# Patient Record
Sex: Female | Born: 1960 | Hispanic: No | Marital: Married | Smoking: Former smoker
Health system: Southern US, Community
[De-identification: ages and names within clinical notes are randomized; demographics above are authoritative.]

## PROBLEM LIST (undated history)

## (undated) DIAGNOSIS — F419 Anxiety disorder, unspecified: Secondary | ICD-10-CM

## (undated) DIAGNOSIS — F329 Major depressive disorder, single episode, unspecified: Secondary | ICD-10-CM

## (undated) DIAGNOSIS — F32A Depression, unspecified: Secondary | ICD-10-CM

## (undated) DIAGNOSIS — J439 Emphysema, unspecified: Secondary | ICD-10-CM

## (undated) HISTORY — PX: BACK SURGERY: SHX140

---
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
## 1998-10-28 ENCOUNTER — Other Ambulatory Visit: Admission: RE | Admit: 1998-10-28 | Discharge: 1998-10-28 | Payer: Self-pay | Admitting: Obstetrics and Gynecology

## 1999-12-30 ENCOUNTER — Other Ambulatory Visit: Admission: RE | Admit: 1999-12-30 | Discharge: 1999-12-30 | Payer: Self-pay | Admitting: Obstetrics and Gynecology

## 2000-10-25 ENCOUNTER — Other Ambulatory Visit: Admission: RE | Admit: 2000-10-25 | Discharge: 2000-10-25 | Payer: Self-pay | Admitting: Obstetrics and Gynecology

## 2002-01-15 ENCOUNTER — Other Ambulatory Visit: Admission: RE | Admit: 2002-01-15 | Discharge: 2002-01-15 | Payer: Self-pay | Admitting: Obstetrics and Gynecology

## 2003-01-18 ENCOUNTER — Other Ambulatory Visit: Admission: RE | Admit: 2003-01-18 | Discharge: 2003-01-18 | Payer: Self-pay | Admitting: Obstetrics and Gynecology

## 2004-01-21 ENCOUNTER — Other Ambulatory Visit: Admission: RE | Admit: 2004-01-21 | Discharge: 2004-01-21 | Payer: Self-pay | Admitting: Obstetrics and Gynecology

## 2005-11-03 ENCOUNTER — Other Ambulatory Visit: Admission: RE | Admit: 2005-11-03 | Discharge: 2005-11-03 | Payer: Self-pay | Admitting: Obstetrics and Gynecology

## 2013-03-03 DIAGNOSIS — F17200 Nicotine dependence, unspecified, uncomplicated: Secondary | ICD-10-CM

## 2013-03-03 DIAGNOSIS — E559 Vitamin D deficiency, unspecified: Secondary | ICD-10-CM

## 2013-03-03 HISTORY — DX: Vitamin D deficiency, unspecified: E55.9

## 2013-03-03 HISTORY — DX: Nicotine dependence, unspecified, uncomplicated: F17.200

## 2013-03-12 ENCOUNTER — Ambulatory Visit: Payer: Self-pay | Admitting: Licensed Clinical Social Worker

## 2013-03-15 DIAGNOSIS — F909 Attention-deficit hyperactivity disorder, unspecified type: Secondary | ICD-10-CM | POA: Diagnosis present

## 2013-03-16 ENCOUNTER — Ambulatory Visit: Payer: Self-pay | Admitting: Licensed Clinical Social Worker

## 2014-03-07 ENCOUNTER — Other Ambulatory Visit: Payer: Self-pay | Admitting: Orthopaedic Surgery

## 2014-03-07 DIAGNOSIS — M545 Low back pain, unspecified: Secondary | ICD-10-CM

## 2014-03-07 DIAGNOSIS — M546 Pain in thoracic spine: Secondary | ICD-10-CM

## 2014-03-14 ENCOUNTER — Other Ambulatory Visit: Payer: Self-pay

## 2014-03-14 ENCOUNTER — Inpatient Hospital Stay
Admission: RE | Admit: 2014-03-14 | Discharge: 2014-03-14 | Disposition: A | Discharge status: Home / Self Care | Payer: Self-pay | Source: Ambulatory Visit | Attending: Orthopaedic Surgery | Admitting: Orthopaedic Surgery

## 2014-03-14 NOTE — Discharge Instructions (Signed)
Myelogram Discharge Instructions  1. Go home and rest quietly for the next 24 hours.  It is important to lie flat for the next 24 hours.  Get up only to go to the restroom.  You may lie in the bed or on a couch on your back, your stomach, your left side or your right side.  You may have one pillow under your head.  You may have pillows between your knees while you are on your side or under your knees while you are on your back.  2. DO NOT drive today.  Recline the seat as far back as it will go, while still wearing your seat belt, on the way home.  3. You may get up to go to the bathroom as needed.  You may sit up for 10 minutes to eat.  You may resume your normal diet and medications unless otherwise indicated.  Drink lots of extra fluids today and tomorrow.  4. The incidence of headache, nausea, or vomiting is about 5% (one in 20 patients).  If you develop a headache, lie flat and drink plenty of fluids until the headache goes away.  Caffeinated beverages may be helpful.  If you develop severe nausea and vomiting or a headache that does not go away with flat bed rest, call [phone].  5. You may resume normal activities after your 24 hours of bed rest is over; however, do not exert yourself strongly or do any heavy lifting tomorrow. If when you get up you have a headache when standing, go back to bed and force fluids for another 24 hours.  6. Call your physician for a follow-up appointment.  The results of your myelogram will be sent directly to your physician by the following day.  7. If you have any questions or if complications develop after you arrive home, please call [phone].  Discharge instructions have been explained to the patient.  The patient, or the person responsible for the patient, fully understands these instructions.      May resume Duloxetine on March 15, 2014, after 11:00 am.

## 2015-02-12 DIAGNOSIS — F331 Major depressive disorder, recurrent, moderate: Secondary | ICD-10-CM | POA: Insufficient documentation

## 2015-10-14 DIAGNOSIS — F901 Attention-deficit hyperactivity disorder, predominantly hyperactive type: Secondary | ICD-10-CM | POA: Diagnosis not present

## 2015-10-14 DIAGNOSIS — F331 Major depressive disorder, recurrent, moderate: Secondary | ICD-10-CM | POA: Diagnosis not present

## 2015-10-14 DIAGNOSIS — F419 Anxiety disorder, unspecified: Secondary | ICD-10-CM | POA: Diagnosis not present

## 2015-10-16 DIAGNOSIS — M545 Low back pain: Secondary | ICD-10-CM | POA: Diagnosis not present

## 2015-10-16 DIAGNOSIS — Z79899 Other long term (current) drug therapy: Secondary | ICD-10-CM | POA: Diagnosis not present

## 2015-10-16 DIAGNOSIS — M419 Scoliosis, unspecified: Secondary | ICD-10-CM | POA: Diagnosis not present

## 2015-10-16 DIAGNOSIS — M5136 Other intervertebral disc degeneration, lumbar region: Secondary | ICD-10-CM | POA: Diagnosis not present

## 2015-10-16 DIAGNOSIS — F419 Anxiety disorder, unspecified: Secondary | ICD-10-CM | POA: Diagnosis not present

## 2015-10-20 DIAGNOSIS — M4125 Other idiopathic scoliosis, thoracolumbar region: Secondary | ICD-10-CM | POA: Diagnosis not present

## 2015-10-20 DIAGNOSIS — Z981 Arthrodesis status: Secondary | ICD-10-CM | POA: Diagnosis not present

## 2015-10-20 DIAGNOSIS — M4325 Fusion of spine, thoracolumbar region: Secondary | ICD-10-CM | POA: Diagnosis not present

## 2015-11-14 DIAGNOSIS — M5136 Other intervertebral disc degeneration, lumbar region: Secondary | ICD-10-CM | POA: Diagnosis not present

## 2015-11-14 DIAGNOSIS — M549 Dorsalgia, unspecified: Secondary | ICD-10-CM | POA: Diagnosis not present

## 2015-11-14 DIAGNOSIS — M545 Low back pain: Secondary | ICD-10-CM | POA: Diagnosis not present

## 2015-11-14 DIAGNOSIS — M419 Scoliosis, unspecified: Secondary | ICD-10-CM | POA: Diagnosis not present

## 2015-11-14 DIAGNOSIS — Z79899 Other long term (current) drug therapy: Secondary | ICD-10-CM | POA: Diagnosis not present

## 2015-12-11 DIAGNOSIS — M419 Scoliosis, unspecified: Secondary | ICD-10-CM | POA: Diagnosis not present

## 2015-12-11 DIAGNOSIS — M545 Low back pain: Secondary | ICD-10-CM | POA: Diagnosis not present

## 2015-12-11 DIAGNOSIS — M5136 Other intervertebral disc degeneration, lumbar region: Secondary | ICD-10-CM | POA: Diagnosis not present

## 2015-12-11 DIAGNOSIS — Z79899 Other long term (current) drug therapy: Secondary | ICD-10-CM | POA: Diagnosis not present

## 2016-01-08 DIAGNOSIS — Z79899 Other long term (current) drug therapy: Secondary | ICD-10-CM | POA: Diagnosis not present

## 2016-01-08 DIAGNOSIS — R0602 Shortness of breath: Secondary | ICD-10-CM | POA: Diagnosis not present

## 2016-01-08 DIAGNOSIS — M129 Arthropathy, unspecified: Secondary | ICD-10-CM | POA: Diagnosis not present

## 2016-01-08 DIAGNOSIS — E559 Vitamin D deficiency, unspecified: Secondary | ICD-10-CM | POA: Diagnosis not present

## 2016-01-08 DIAGNOSIS — M539 Dorsopathy, unspecified: Secondary | ICD-10-CM | POA: Diagnosis not present

## 2016-01-08 DIAGNOSIS — M545 Low back pain: Secondary | ICD-10-CM | POA: Diagnosis not present

## 2016-01-08 DIAGNOSIS — F17200 Nicotine dependence, unspecified, uncomplicated: Secondary | ICD-10-CM | POA: Diagnosis not present

## 2016-01-08 DIAGNOSIS — R8290 Unspecified abnormal findings in urine: Secondary | ICD-10-CM | POA: Diagnosis not present

## 2016-01-08 DIAGNOSIS — E7800 Pure hypercholesterolemia, unspecified: Secondary | ICD-10-CM | POA: Diagnosis not present

## 2016-01-08 DIAGNOSIS — D539 Nutritional anemia, unspecified: Secondary | ICD-10-CM | POA: Diagnosis not present

## 2016-01-08 DIAGNOSIS — R5383 Other fatigue: Secondary | ICD-10-CM | POA: Diagnosis not present

## 2016-01-08 DIAGNOSIS — Z720 Tobacco use: Secondary | ICD-10-CM | POA: Diagnosis not present

## 2016-01-08 DIAGNOSIS — R300 Dysuria: Secondary | ICD-10-CM | POA: Diagnosis not present

## 2016-02-05 DIAGNOSIS — M545 Low back pain: Secondary | ICD-10-CM | POA: Diagnosis not present

## 2016-02-05 DIAGNOSIS — M539 Dorsopathy, unspecified: Secondary | ICD-10-CM | POA: Diagnosis not present

## 2016-02-05 DIAGNOSIS — Z79899 Other long term (current) drug therapy: Secondary | ICD-10-CM | POA: Diagnosis not present

## 2016-02-05 DIAGNOSIS — M419 Scoliosis, unspecified: Secondary | ICD-10-CM | POA: Diagnosis not present

## 2016-02-05 DIAGNOSIS — E7800 Pure hypercholesterolemia, unspecified: Secondary | ICD-10-CM | POA: Diagnosis not present

## 2016-02-10 DIAGNOSIS — F17200 Nicotine dependence, unspecified, uncomplicated: Secondary | ICD-10-CM | POA: Diagnosis not present

## 2016-02-10 DIAGNOSIS — F331 Major depressive disorder, recurrent, moderate: Secondary | ICD-10-CM | POA: Diagnosis not present

## 2016-03-04 DIAGNOSIS — M62838 Other muscle spasm: Secondary | ICD-10-CM | POA: Diagnosis not present

## 2016-03-04 DIAGNOSIS — M545 Low back pain: Secondary | ICD-10-CM | POA: Diagnosis not present

## 2016-03-04 DIAGNOSIS — M5136 Other intervertebral disc degeneration, lumbar region: Secondary | ICD-10-CM | POA: Diagnosis not present

## 2016-03-04 DIAGNOSIS — Z79899 Other long term (current) drug therapy: Secondary | ICD-10-CM | POA: Diagnosis not present

## 2016-03-04 DIAGNOSIS — M419 Scoliosis, unspecified: Secondary | ICD-10-CM | POA: Diagnosis not present

## 2016-04-01 DIAGNOSIS — Z79899 Other long term (current) drug therapy: Secondary | ICD-10-CM | POA: Diagnosis not present

## 2016-04-01 DIAGNOSIS — M545 Low back pain: Secondary | ICD-10-CM | POA: Diagnosis not present

## 2016-04-01 DIAGNOSIS — M419 Scoliosis, unspecified: Secondary | ICD-10-CM | POA: Diagnosis not present

## 2016-04-01 DIAGNOSIS — M539 Dorsopathy, unspecified: Secondary | ICD-10-CM | POA: Diagnosis not present

## 2016-04-01 DIAGNOSIS — M62838 Other muscle spasm: Secondary | ICD-10-CM | POA: Diagnosis not present

## 2016-04-19 DIAGNOSIS — M438X9 Other specified deforming dorsopathies, site unspecified: Secondary | ICD-10-CM | POA: Diagnosis not present

## 2016-04-19 DIAGNOSIS — F17200 Nicotine dependence, unspecified, uncomplicated: Secondary | ICD-10-CM | POA: Diagnosis not present

## 2016-04-19 DIAGNOSIS — Z981 Arthrodesis status: Secondary | ICD-10-CM | POA: Diagnosis not present

## 2016-04-19 DIAGNOSIS — Z4889 Encounter for other specified surgical aftercare: Secondary | ICD-10-CM | POA: Diagnosis not present

## 2016-04-19 DIAGNOSIS — R293 Abnormal posture: Secondary | ICD-10-CM | POA: Diagnosis not present

## 2016-04-19 DIAGNOSIS — M4325 Fusion of spine, thoracolumbar region: Secondary | ICD-10-CM | POA: Diagnosis not present

## 2016-04-29 DIAGNOSIS — M791 Myalgia: Secondary | ICD-10-CM | POA: Diagnosis not present

## 2016-04-29 DIAGNOSIS — Z79899 Other long term (current) drug therapy: Secondary | ICD-10-CM | POA: Diagnosis not present

## 2016-04-29 DIAGNOSIS — M545 Low back pain: Secondary | ICD-10-CM | POA: Diagnosis not present

## 2016-04-29 DIAGNOSIS — G8929 Other chronic pain: Secondary | ICD-10-CM | POA: Diagnosis not present

## 2016-04-29 DIAGNOSIS — M419 Scoliosis, unspecified: Secondary | ICD-10-CM | POA: Diagnosis not present

## 2016-05-12 DIAGNOSIS — F331 Major depressive disorder, recurrent, moderate: Secondary | ICD-10-CM | POA: Diagnosis not present

## 2016-05-27 DIAGNOSIS — M539 Dorsopathy, unspecified: Secondary | ICD-10-CM | POA: Diagnosis not present

## 2016-05-27 DIAGNOSIS — Z79899 Other long term (current) drug therapy: Secondary | ICD-10-CM | POA: Diagnosis not present

## 2016-05-27 DIAGNOSIS — M545 Low back pain: Secondary | ICD-10-CM | POA: Diagnosis not present

## 2016-05-27 DIAGNOSIS — M419 Scoliosis, unspecified: Secondary | ICD-10-CM | POA: Diagnosis not present

## 2016-05-27 DIAGNOSIS — M5416 Radiculopathy, lumbar region: Secondary | ICD-10-CM | POA: Diagnosis not present

## 2016-06-24 DIAGNOSIS — E782 Mixed hyperlipidemia: Secondary | ICD-10-CM | POA: Diagnosis not present

## 2016-06-24 DIAGNOSIS — F17200 Nicotine dependence, unspecified, uncomplicated: Secondary | ICD-10-CM | POA: Diagnosis not present

## 2016-06-24 DIAGNOSIS — Z87891 Personal history of nicotine dependence: Secondary | ICD-10-CM | POA: Diagnosis not present

## 2016-06-24 DIAGNOSIS — Z1389 Encounter for screening for other disorder: Secondary | ICD-10-CM | POA: Diagnosis not present

## 2016-06-24 DIAGNOSIS — M545 Low back pain: Secondary | ICD-10-CM | POA: Diagnosis not present

## 2016-06-24 DIAGNOSIS — R0602 Shortness of breath: Secondary | ICD-10-CM | POA: Diagnosis not present

## 2016-06-24 DIAGNOSIS — Z79899 Other long term (current) drug therapy: Secondary | ICD-10-CM | POA: Diagnosis not present

## 2016-07-22 DIAGNOSIS — Z79899 Other long term (current) drug therapy: Secondary | ICD-10-CM | POA: Diagnosis not present

## 2016-07-22 DIAGNOSIS — Z23 Encounter for immunization: Secondary | ICD-10-CM | POA: Diagnosis not present

## 2016-07-22 DIAGNOSIS — M546 Pain in thoracic spine: Secondary | ICD-10-CM | POA: Diagnosis not present

## 2016-07-22 DIAGNOSIS — M5136 Other intervertebral disc degeneration, lumbar region: Secondary | ICD-10-CM | POA: Diagnosis not present

## 2016-07-22 DIAGNOSIS — M545 Low back pain: Secondary | ICD-10-CM | POA: Diagnosis not present

## 2016-07-22 DIAGNOSIS — M419 Scoliosis, unspecified: Secondary | ICD-10-CM | POA: Diagnosis not present

## 2016-08-04 DIAGNOSIS — F331 Major depressive disorder, recurrent, moderate: Secondary | ICD-10-CM | POA: Diagnosis not present

## 2016-08-17 DIAGNOSIS — E559 Vitamin D deficiency, unspecified: Secondary | ICD-10-CM | POA: Diagnosis not present

## 2016-08-17 DIAGNOSIS — M419 Scoliosis, unspecified: Secondary | ICD-10-CM | POA: Diagnosis not present

## 2016-08-17 DIAGNOSIS — M545 Low back pain: Secondary | ICD-10-CM | POA: Diagnosis not present

## 2016-08-17 DIAGNOSIS — Z79899 Other long term (current) drug therapy: Secondary | ICD-10-CM | POA: Diagnosis not present

## 2016-08-17 DIAGNOSIS — R300 Dysuria: Secondary | ICD-10-CM | POA: Diagnosis not present

## 2016-08-17 DIAGNOSIS — D539 Nutritional anemia, unspecified: Secondary | ICD-10-CM | POA: Diagnosis not present

## 2016-08-17 DIAGNOSIS — M129 Arthropathy, unspecified: Secondary | ICD-10-CM | POA: Diagnosis not present

## 2016-08-17 DIAGNOSIS — E7800 Pure hypercholesterolemia, unspecified: Secondary | ICD-10-CM | POA: Diagnosis not present

## 2016-08-17 DIAGNOSIS — R5383 Other fatigue: Secondary | ICD-10-CM | POA: Diagnosis not present

## 2016-08-17 DIAGNOSIS — M5136 Other intervertebral disc degeneration, lumbar region: Secondary | ICD-10-CM | POA: Diagnosis not present

## 2016-09-14 DIAGNOSIS — E7800 Pure hypercholesterolemia, unspecified: Secondary | ICD-10-CM | POA: Diagnosis not present

## 2016-09-14 DIAGNOSIS — M545 Low back pain: Secondary | ICD-10-CM | POA: Diagnosis not present

## 2016-09-14 DIAGNOSIS — M5136 Other intervertebral disc degeneration, lumbar region: Secondary | ICD-10-CM | POA: Diagnosis not present

## 2016-09-14 DIAGNOSIS — Z79899 Other long term (current) drug therapy: Secondary | ICD-10-CM | POA: Diagnosis not present

## 2016-09-14 DIAGNOSIS — M419 Scoliosis, unspecified: Secondary | ICD-10-CM | POA: Diagnosis not present

## 2016-10-12 DIAGNOSIS — M419 Scoliosis, unspecified: Secondary | ICD-10-CM | POA: Diagnosis not present

## 2016-10-12 DIAGNOSIS — M5136 Other intervertebral disc degeneration, lumbar region: Secondary | ICD-10-CM | POA: Diagnosis not present

## 2016-10-12 DIAGNOSIS — M546 Pain in thoracic spine: Secondary | ICD-10-CM | POA: Diagnosis not present

## 2016-10-12 DIAGNOSIS — Z79899 Other long term (current) drug therapy: Secondary | ICD-10-CM | POA: Diagnosis not present

## 2016-10-12 DIAGNOSIS — M545 Low back pain: Secondary | ICD-10-CM | POA: Diagnosis not present

## 2016-11-04 DIAGNOSIS — F429 Obsessive-compulsive disorder, unspecified: Secondary | ICD-10-CM | POA: Diagnosis not present

## 2016-11-04 DIAGNOSIS — F419 Anxiety disorder, unspecified: Secondary | ICD-10-CM | POA: Diagnosis not present

## 2016-11-04 DIAGNOSIS — F331 Major depressive disorder, recurrent, moderate: Secondary | ICD-10-CM | POA: Diagnosis not present

## 2016-11-09 DIAGNOSIS — Z79899 Other long term (current) drug therapy: Secondary | ICD-10-CM | POA: Diagnosis not present

## 2016-11-09 DIAGNOSIS — M5136 Other intervertebral disc degeneration, lumbar region: Secondary | ICD-10-CM | POA: Diagnosis not present

## 2016-11-09 DIAGNOSIS — M5416 Radiculopathy, lumbar region: Secondary | ICD-10-CM | POA: Diagnosis not present

## 2016-11-09 DIAGNOSIS — M419 Scoliosis, unspecified: Secondary | ICD-10-CM | POA: Diagnosis not present

## 2016-11-09 DIAGNOSIS — M545 Low back pain: Secondary | ICD-10-CM | POA: Diagnosis not present

## 2016-12-07 DIAGNOSIS — Z79899 Other long term (current) drug therapy: Secondary | ICD-10-CM | POA: Diagnosis not present

## 2016-12-07 DIAGNOSIS — M419 Scoliosis, unspecified: Secondary | ICD-10-CM | POA: Diagnosis not present

## 2016-12-07 DIAGNOSIS — M5136 Other intervertebral disc degeneration, lumbar region: Secondary | ICD-10-CM | POA: Diagnosis not present

## 2016-12-07 DIAGNOSIS — M545 Low back pain: Secondary | ICD-10-CM | POA: Diagnosis not present

## 2017-01-04 DIAGNOSIS — M5136 Other intervertebral disc degeneration, lumbar region: Secondary | ICD-10-CM | POA: Diagnosis not present

## 2017-01-04 DIAGNOSIS — Z0000 Encounter for general adult medical examination without abnormal findings: Secondary | ICD-10-CM | POA: Diagnosis not present

## 2017-01-04 DIAGNOSIS — Z79899 Other long term (current) drug therapy: Secondary | ICD-10-CM | POA: Diagnosis not present

## 2017-01-04 DIAGNOSIS — M545 Low back pain: Secondary | ICD-10-CM | POA: Diagnosis not present

## 2017-01-04 DIAGNOSIS — M546 Pain in thoracic spine: Secondary | ICD-10-CM | POA: Diagnosis not present

## 2017-01-04 DIAGNOSIS — M419 Scoliosis, unspecified: Secondary | ICD-10-CM | POA: Diagnosis not present

## 2017-01-10 DIAGNOSIS — M4325 Fusion of spine, thoracolumbar region: Secondary | ICD-10-CM | POA: Diagnosis not present

## 2017-01-10 DIAGNOSIS — Z006 Encounter for examination for normal comparison and control in clinical research program: Secondary | ICD-10-CM | POA: Diagnosis not present

## 2017-01-10 DIAGNOSIS — M549 Dorsalgia, unspecified: Secondary | ICD-10-CM | POA: Diagnosis not present

## 2017-01-10 DIAGNOSIS — Z981 Arthrodesis status: Secondary | ICD-10-CM | POA: Diagnosis not present

## 2017-01-27 DIAGNOSIS — F339 Major depressive disorder, recurrent, unspecified: Secondary | ICD-10-CM | POA: Diagnosis not present

## 2017-01-27 DIAGNOSIS — F419 Anxiety disorder, unspecified: Secondary | ICD-10-CM | POA: Diagnosis not present

## 2017-01-27 DIAGNOSIS — F429 Obsessive-compulsive disorder, unspecified: Secondary | ICD-10-CM | POA: Diagnosis not present

## 2017-02-01 DIAGNOSIS — M419 Scoliosis, unspecified: Secondary | ICD-10-CM | POA: Diagnosis not present

## 2017-02-01 DIAGNOSIS — Z79899 Other long term (current) drug therapy: Secondary | ICD-10-CM | POA: Diagnosis not present

## 2017-02-01 DIAGNOSIS — G8929 Other chronic pain: Secondary | ICD-10-CM | POA: Diagnosis not present

## 2017-02-01 DIAGNOSIS — M791 Myalgia: Secondary | ICD-10-CM | POA: Diagnosis not present

## 2017-02-01 DIAGNOSIS — M545 Low back pain: Secondary | ICD-10-CM | POA: Diagnosis not present

## 2017-03-01 DIAGNOSIS — M545 Low back pain: Secondary | ICD-10-CM | POA: Diagnosis not present

## 2017-03-01 DIAGNOSIS — M5136 Other intervertebral disc degeneration, lumbar region: Secondary | ICD-10-CM | POA: Diagnosis not present

## 2017-03-01 DIAGNOSIS — M419 Scoliosis, unspecified: Secondary | ICD-10-CM | POA: Diagnosis not present

## 2017-03-01 DIAGNOSIS — Z79899 Other long term (current) drug therapy: Secondary | ICD-10-CM | POA: Diagnosis not present

## 2017-03-29 DIAGNOSIS — M5416 Radiculopathy, lumbar region: Secondary | ICD-10-CM | POA: Diagnosis not present

## 2017-03-29 DIAGNOSIS — Z79899 Other long term (current) drug therapy: Secondary | ICD-10-CM | POA: Diagnosis not present

## 2017-03-29 DIAGNOSIS — M419 Scoliosis, unspecified: Secondary | ICD-10-CM | POA: Diagnosis not present

## 2017-03-29 DIAGNOSIS — M5136 Other intervertebral disc degeneration, lumbar region: Secondary | ICD-10-CM | POA: Diagnosis not present

## 2017-03-29 DIAGNOSIS — M545 Low back pain: Secondary | ICD-10-CM | POA: Diagnosis not present

## 2017-04-28 DIAGNOSIS — Z79899 Other long term (current) drug therapy: Secondary | ICD-10-CM | POA: Diagnosis not present

## 2017-04-28 DIAGNOSIS — M5136 Other intervertebral disc degeneration, lumbar region: Secondary | ICD-10-CM | POA: Diagnosis not present

## 2017-04-28 DIAGNOSIS — M545 Low back pain: Secondary | ICD-10-CM | POA: Diagnosis not present

## 2017-04-28 DIAGNOSIS — E559 Vitamin D deficiency, unspecified: Secondary | ICD-10-CM | POA: Diagnosis not present

## 2017-04-28 DIAGNOSIS — E782 Mixed hyperlipidemia: Secondary | ICD-10-CM | POA: Diagnosis not present

## 2017-04-28 DIAGNOSIS — D539 Nutritional anemia, unspecified: Secondary | ICD-10-CM | POA: Diagnosis not present

## 2017-04-28 DIAGNOSIS — M419 Scoliosis, unspecified: Secondary | ICD-10-CM | POA: Diagnosis not present

## 2017-04-28 DIAGNOSIS — M129 Arthropathy, unspecified: Secondary | ICD-10-CM | POA: Diagnosis not present

## 2017-04-28 DIAGNOSIS — E7800 Pure hypercholesterolemia, unspecified: Secondary | ICD-10-CM | POA: Diagnosis not present

## 2017-04-28 DIAGNOSIS — R5383 Other fatigue: Secondary | ICD-10-CM | POA: Diagnosis not present

## 2017-05-26 DIAGNOSIS — Z79899 Other long term (current) drug therapy: Secondary | ICD-10-CM | POA: Diagnosis not present

## 2017-05-26 DIAGNOSIS — K589 Irritable bowel syndrome without diarrhea: Secondary | ICD-10-CM | POA: Diagnosis not present

## 2017-05-26 DIAGNOSIS — M419 Scoliosis, unspecified: Secondary | ICD-10-CM | POA: Diagnosis not present

## 2017-05-26 DIAGNOSIS — M5136 Other intervertebral disc degeneration, lumbar region: Secondary | ICD-10-CM | POA: Diagnosis not present

## 2017-05-26 DIAGNOSIS — M545 Low back pain: Secondary | ICD-10-CM | POA: Diagnosis not present

## 2017-06-23 DIAGNOSIS — Z79899 Other long term (current) drug therapy: Secondary | ICD-10-CM | POA: Diagnosis not present

## 2017-06-23 DIAGNOSIS — Z23 Encounter for immunization: Secondary | ICD-10-CM | POA: Diagnosis not present

## 2017-06-23 DIAGNOSIS — M419 Scoliosis, unspecified: Secondary | ICD-10-CM | POA: Diagnosis not present

## 2017-06-23 DIAGNOSIS — M5136 Other intervertebral disc degeneration, lumbar region: Secondary | ICD-10-CM | POA: Diagnosis not present

## 2017-06-23 DIAGNOSIS — M545 Low back pain: Secondary | ICD-10-CM | POA: Diagnosis not present

## 2017-07-21 DIAGNOSIS — M539 Dorsopathy, unspecified: Secondary | ICD-10-CM | POA: Diagnosis not present

## 2017-07-21 DIAGNOSIS — M419 Scoliosis, unspecified: Secondary | ICD-10-CM | POA: Diagnosis not present

## 2017-07-21 DIAGNOSIS — M545 Low back pain: Secondary | ICD-10-CM | POA: Diagnosis not present

## 2017-07-21 DIAGNOSIS — Z79899 Other long term (current) drug therapy: Secondary | ICD-10-CM | POA: Diagnosis not present

## 2017-07-21 DIAGNOSIS — M48061 Spinal stenosis, lumbar region without neurogenic claudication: Secondary | ICD-10-CM | POA: Diagnosis not present

## 2017-07-29 DIAGNOSIS — F429 Obsessive-compulsive disorder, unspecified: Secondary | ICD-10-CM | POA: Diagnosis not present

## 2017-07-29 DIAGNOSIS — F339 Major depressive disorder, recurrent, unspecified: Secondary | ICD-10-CM | POA: Diagnosis not present

## 2017-07-29 DIAGNOSIS — F419 Anxiety disorder, unspecified: Secondary | ICD-10-CM | POA: Diagnosis not present

## 2017-08-17 DIAGNOSIS — M545 Low back pain: Secondary | ICD-10-CM | POA: Diagnosis not present

## 2017-08-17 DIAGNOSIS — Z79899 Other long term (current) drug therapy: Secondary | ICD-10-CM | POA: Diagnosis not present

## 2017-08-17 DIAGNOSIS — M419 Scoliosis, unspecified: Secondary | ICD-10-CM | POA: Diagnosis not present

## 2017-08-17 DIAGNOSIS — M5136 Other intervertebral disc degeneration, lumbar region: Secondary | ICD-10-CM | POA: Diagnosis not present

## 2017-09-15 DIAGNOSIS — M5136 Other intervertebral disc degeneration, lumbar region: Secondary | ICD-10-CM | POA: Diagnosis not present

## 2017-09-15 DIAGNOSIS — F411 Generalized anxiety disorder: Secondary | ICD-10-CM | POA: Diagnosis not present

## 2017-09-15 DIAGNOSIS — M419 Scoliosis, unspecified: Secondary | ICD-10-CM | POA: Diagnosis not present

## 2017-09-15 DIAGNOSIS — M545 Low back pain: Secondary | ICD-10-CM | POA: Diagnosis not present

## 2017-09-15 DIAGNOSIS — Z79899 Other long term (current) drug therapy: Secondary | ICD-10-CM | POA: Diagnosis not present

## 2017-10-13 DIAGNOSIS — Z79899 Other long term (current) drug therapy: Secondary | ICD-10-CM | POA: Diagnosis not present

## 2017-10-13 DIAGNOSIS — M5136 Other intervertebral disc degeneration, lumbar region: Secondary | ICD-10-CM | POA: Diagnosis not present

## 2017-10-13 DIAGNOSIS — M546 Pain in thoracic spine: Secondary | ICD-10-CM | POA: Diagnosis not present

## 2017-10-13 DIAGNOSIS — M545 Low back pain: Secondary | ICD-10-CM | POA: Diagnosis not present

## 2017-10-13 DIAGNOSIS — F411 Generalized anxiety disorder: Secondary | ICD-10-CM | POA: Diagnosis not present

## 2017-10-26 DIAGNOSIS — F419 Anxiety disorder, unspecified: Secondary | ICD-10-CM | POA: Diagnosis not present

## 2017-10-26 DIAGNOSIS — F330 Major depressive disorder, recurrent, mild: Secondary | ICD-10-CM | POA: Diagnosis not present

## 2017-10-26 DIAGNOSIS — F429 Obsessive-compulsive disorder, unspecified: Secondary | ICD-10-CM | POA: Diagnosis not present

## 2017-11-10 DIAGNOSIS — Z79899 Other long term (current) drug therapy: Secondary | ICD-10-CM | POA: Diagnosis not present

## 2017-11-10 DIAGNOSIS — M5136 Other intervertebral disc degeneration, lumbar region: Secondary | ICD-10-CM | POA: Diagnosis not present

## 2017-11-10 DIAGNOSIS — M546 Pain in thoracic spine: Secondary | ICD-10-CM | POA: Diagnosis not present

## 2017-11-10 DIAGNOSIS — M5416 Radiculopathy, lumbar region: Secondary | ICD-10-CM | POA: Diagnosis not present

## 2017-11-10 DIAGNOSIS — M545 Low back pain: Secondary | ICD-10-CM | POA: Diagnosis not present

## 2017-12-08 DIAGNOSIS — M5136 Other intervertebral disc degeneration, lumbar region: Secondary | ICD-10-CM | POA: Diagnosis not present

## 2017-12-08 DIAGNOSIS — Z79899 Other long term (current) drug therapy: Secondary | ICD-10-CM | POA: Diagnosis not present

## 2017-12-08 DIAGNOSIS — F411 Generalized anxiety disorder: Secondary | ICD-10-CM | POA: Diagnosis not present

## 2017-12-08 DIAGNOSIS — M545 Low back pain: Secondary | ICD-10-CM | POA: Diagnosis not present

## 2017-12-08 DIAGNOSIS — Z780 Asymptomatic menopausal state: Secondary | ICD-10-CM | POA: Diagnosis not present

## 2017-12-08 DIAGNOSIS — M546 Pain in thoracic spine: Secondary | ICD-10-CM | POA: Diagnosis not present

## 2018-01-09 DIAGNOSIS — Z79899 Other long term (current) drug therapy: Secondary | ICD-10-CM | POA: Diagnosis not present

## 2018-01-09 DIAGNOSIS — M419 Scoliosis, unspecified: Secondary | ICD-10-CM | POA: Diagnosis not present

## 2018-01-09 DIAGNOSIS — M961 Postlaminectomy syndrome, not elsewhere classified: Secondary | ICD-10-CM | POA: Diagnosis not present

## 2018-01-09 DIAGNOSIS — M545 Low back pain: Secondary | ICD-10-CM | POA: Diagnosis not present

## 2018-01-09 DIAGNOSIS — Z0000 Encounter for general adult medical examination without abnormal findings: Secondary | ICD-10-CM | POA: Diagnosis not present

## 2018-01-09 DIAGNOSIS — M5136 Other intervertebral disc degeneration, lumbar region: Secondary | ICD-10-CM | POA: Diagnosis not present

## 2018-01-20 DIAGNOSIS — F429 Obsessive-compulsive disorder, unspecified: Secondary | ICD-10-CM | POA: Diagnosis not present

## 2018-01-20 DIAGNOSIS — F411 Generalized anxiety disorder: Secondary | ICD-10-CM | POA: Diagnosis not present

## 2018-01-20 DIAGNOSIS — F330 Major depressive disorder, recurrent, mild: Secondary | ICD-10-CM | POA: Diagnosis not present

## 2018-02-07 DIAGNOSIS — M545 Low back pain: Secondary | ICD-10-CM | POA: Diagnosis not present

## 2018-02-07 DIAGNOSIS — M419 Scoliosis, unspecified: Secondary | ICD-10-CM | POA: Diagnosis not present

## 2018-02-07 DIAGNOSIS — M5136 Other intervertebral disc degeneration, lumbar region: Secondary | ICD-10-CM | POA: Diagnosis not present

## 2018-02-07 DIAGNOSIS — M5416 Radiculopathy, lumbar region: Secondary | ICD-10-CM | POA: Diagnosis not present

## 2018-02-07 DIAGNOSIS — Z79899 Other long term (current) drug therapy: Secondary | ICD-10-CM | POA: Diagnosis not present

## 2018-03-08 DIAGNOSIS — M419 Scoliosis, unspecified: Secondary | ICD-10-CM | POA: Diagnosis not present

## 2018-03-08 DIAGNOSIS — M5136 Other intervertebral disc degeneration, lumbar region: Secondary | ICD-10-CM | POA: Diagnosis not present

## 2018-03-08 DIAGNOSIS — M545 Low back pain: Secondary | ICD-10-CM | POA: Diagnosis not present

## 2018-03-08 DIAGNOSIS — M961 Postlaminectomy syndrome, not elsewhere classified: Secondary | ICD-10-CM | POA: Diagnosis not present

## 2018-03-08 DIAGNOSIS — Z79899 Other long term (current) drug therapy: Secondary | ICD-10-CM | POA: Diagnosis not present

## 2018-04-04 DIAGNOSIS — Z87891 Personal history of nicotine dependence: Secondary | ICD-10-CM | POA: Diagnosis not present

## 2018-04-04 DIAGNOSIS — M5136 Other intervertebral disc degeneration, lumbar region: Secondary | ICD-10-CM | POA: Diagnosis not present

## 2018-04-04 DIAGNOSIS — M419 Scoliosis, unspecified: Secondary | ICD-10-CM | POA: Diagnosis not present

## 2018-04-04 DIAGNOSIS — Z79899 Other long term (current) drug therapy: Secondary | ICD-10-CM | POA: Diagnosis not present

## 2018-04-04 DIAGNOSIS — M545 Low back pain: Secondary | ICD-10-CM | POA: Diagnosis not present

## 2018-04-04 DIAGNOSIS — F17200 Nicotine dependence, unspecified, uncomplicated: Secondary | ICD-10-CM | POA: Diagnosis not present

## 2018-04-21 DIAGNOSIS — F411 Generalized anxiety disorder: Secondary | ICD-10-CM | POA: Diagnosis not present

## 2018-04-21 DIAGNOSIS — F330 Major depressive disorder, recurrent, mild: Secondary | ICD-10-CM | POA: Diagnosis not present

## 2018-04-21 DIAGNOSIS — F429 Obsessive-compulsive disorder, unspecified: Secondary | ICD-10-CM | POA: Diagnosis not present

## 2018-05-02 DIAGNOSIS — M545 Low back pain: Secondary | ICD-10-CM | POA: Diagnosis not present

## 2018-05-02 DIAGNOSIS — Z79899 Other long term (current) drug therapy: Secondary | ICD-10-CM | POA: Diagnosis not present

## 2018-05-02 DIAGNOSIS — M419 Scoliosis, unspecified: Secondary | ICD-10-CM | POA: Diagnosis not present

## 2018-05-30 DIAGNOSIS — M545 Low back pain: Secondary | ICD-10-CM | POA: Diagnosis not present

## 2018-05-30 DIAGNOSIS — Z79899 Other long term (current) drug therapy: Secondary | ICD-10-CM | POA: Diagnosis not present

## 2018-05-30 DIAGNOSIS — M419 Scoliosis, unspecified: Secondary | ICD-10-CM | POA: Diagnosis not present

## 2018-05-30 DIAGNOSIS — G8929 Other chronic pain: Secondary | ICD-10-CM | POA: Diagnosis not present

## 2018-05-30 DIAGNOSIS — M7918 Myalgia, other site: Secondary | ICD-10-CM | POA: Diagnosis not present

## 2018-06-27 DIAGNOSIS — D539 Nutritional anemia, unspecified: Secondary | ICD-10-CM | POA: Diagnosis not present

## 2018-06-27 DIAGNOSIS — R300 Dysuria: Secondary | ICD-10-CM | POA: Diagnosis not present

## 2018-06-27 DIAGNOSIS — E7800 Pure hypercholesterolemia, unspecified: Secondary | ICD-10-CM | POA: Diagnosis not present

## 2018-06-27 DIAGNOSIS — Z79899 Other long term (current) drug therapy: Secondary | ICD-10-CM | POA: Diagnosis not present

## 2018-06-27 DIAGNOSIS — M419 Scoliosis, unspecified: Secondary | ICD-10-CM | POA: Diagnosis not present

## 2018-06-27 DIAGNOSIS — M545 Low back pain: Secondary | ICD-10-CM | POA: Diagnosis not present

## 2018-06-27 DIAGNOSIS — R5383 Other fatigue: Secondary | ICD-10-CM | POA: Diagnosis not present

## 2018-06-27 DIAGNOSIS — F17200 Nicotine dependence, unspecified, uncomplicated: Secondary | ICD-10-CM | POA: Diagnosis not present

## 2018-06-27 DIAGNOSIS — Z87891 Personal history of nicotine dependence: Secondary | ICD-10-CM | POA: Diagnosis not present

## 2018-06-27 DIAGNOSIS — E559 Vitamin D deficiency, unspecified: Secondary | ICD-10-CM | POA: Diagnosis not present

## 2018-06-27 DIAGNOSIS — M129 Arthropathy, unspecified: Secondary | ICD-10-CM | POA: Diagnosis not present

## 2018-06-27 DIAGNOSIS — Z23 Encounter for immunization: Secondary | ICD-10-CM | POA: Diagnosis not present

## 2018-07-14 DIAGNOSIS — F330 Major depressive disorder, recurrent, mild: Secondary | ICD-10-CM | POA: Diagnosis not present

## 2018-07-14 DIAGNOSIS — F411 Generalized anxiety disorder: Secondary | ICD-10-CM | POA: Diagnosis not present

## 2018-07-14 DIAGNOSIS — F429 Obsessive-compulsive disorder, unspecified: Secondary | ICD-10-CM | POA: Diagnosis not present

## 2018-07-25 DIAGNOSIS — M419 Scoliosis, unspecified: Secondary | ICD-10-CM | POA: Diagnosis not present

## 2018-07-25 DIAGNOSIS — Z79899 Other long term (current) drug therapy: Secondary | ICD-10-CM | POA: Diagnosis not present

## 2018-07-25 DIAGNOSIS — M545 Low back pain: Secondary | ICD-10-CM | POA: Diagnosis not present

## 2018-07-25 DIAGNOSIS — M5136 Other intervertebral disc degeneration, lumbar region: Secondary | ICD-10-CM | POA: Diagnosis not present

## 2018-08-22 DIAGNOSIS — Z79899 Other long term (current) drug therapy: Secondary | ICD-10-CM | POA: Diagnosis not present

## 2018-08-22 DIAGNOSIS — M5136 Other intervertebral disc degeneration, lumbar region: Secondary | ICD-10-CM | POA: Diagnosis not present

## 2018-08-22 DIAGNOSIS — M545 Low back pain: Secondary | ICD-10-CM | POA: Diagnosis not present

## 2018-08-22 DIAGNOSIS — M419 Scoliosis, unspecified: Secondary | ICD-10-CM | POA: Diagnosis not present

## 2018-08-22 DIAGNOSIS — M961 Postlaminectomy syndrome, not elsewhere classified: Secondary | ICD-10-CM | POA: Diagnosis not present

## 2018-09-12 DIAGNOSIS — M545 Low back pain: Secondary | ICD-10-CM | POA: Diagnosis not present

## 2018-09-12 DIAGNOSIS — M419 Scoliosis, unspecified: Secondary | ICD-10-CM | POA: Diagnosis not present

## 2018-09-12 DIAGNOSIS — Z79899 Other long term (current) drug therapy: Secondary | ICD-10-CM | POA: Diagnosis not present

## 2018-09-12 DIAGNOSIS — M961 Postlaminectomy syndrome, not elsewhere classified: Secondary | ICD-10-CM | POA: Diagnosis not present

## 2018-09-12 DIAGNOSIS — M5136 Other intervertebral disc degeneration, lumbar region: Secondary | ICD-10-CM | POA: Diagnosis not present

## 2018-10-10 DIAGNOSIS — F411 Generalized anxiety disorder: Secondary | ICD-10-CM | POA: Diagnosis not present

## 2018-10-10 DIAGNOSIS — F429 Obsessive-compulsive disorder, unspecified: Secondary | ICD-10-CM | POA: Diagnosis not present

## 2018-10-10 DIAGNOSIS — F330 Major depressive disorder, recurrent, mild: Secondary | ICD-10-CM | POA: Diagnosis not present

## 2018-10-17 DIAGNOSIS — Z79899 Other long term (current) drug therapy: Secondary | ICD-10-CM | POA: Diagnosis not present

## 2018-10-17 DIAGNOSIS — M546 Pain in thoracic spine: Secondary | ICD-10-CM | POA: Diagnosis not present

## 2018-10-17 DIAGNOSIS — M545 Low back pain: Secondary | ICD-10-CM | POA: Diagnosis not present

## 2018-10-17 DIAGNOSIS — M5136 Other intervertebral disc degeneration, lumbar region: Secondary | ICD-10-CM | POA: Diagnosis not present

## 2018-10-17 DIAGNOSIS — M419 Scoliosis, unspecified: Secondary | ICD-10-CM | POA: Diagnosis not present

## 2018-11-14 DIAGNOSIS — D539 Nutritional anemia, unspecified: Secondary | ICD-10-CM | POA: Diagnosis not present

## 2018-11-14 DIAGNOSIS — E559 Vitamin D deficiency, unspecified: Secondary | ICD-10-CM | POA: Diagnosis not present

## 2018-11-14 DIAGNOSIS — R300 Dysuria: Secondary | ICD-10-CM | POA: Diagnosis not present

## 2018-11-14 DIAGNOSIS — E7800 Pure hypercholesterolemia, unspecified: Secondary | ICD-10-CM | POA: Diagnosis not present

## 2018-11-14 DIAGNOSIS — M129 Arthropathy, unspecified: Secondary | ICD-10-CM | POA: Diagnosis not present

## 2018-11-14 DIAGNOSIS — M5136 Other intervertebral disc degeneration, lumbar region: Secondary | ICD-10-CM | POA: Diagnosis not present

## 2018-11-14 DIAGNOSIS — Z79899 Other long term (current) drug therapy: Secondary | ICD-10-CM | POA: Diagnosis not present

## 2018-11-14 DIAGNOSIS — M545 Low back pain: Secondary | ICD-10-CM | POA: Diagnosis not present

## 2018-11-14 DIAGNOSIS — M419 Scoliosis, unspecified: Secondary | ICD-10-CM | POA: Diagnosis not present

## 2018-11-14 DIAGNOSIS — R5383 Other fatigue: Secondary | ICD-10-CM | POA: Diagnosis not present

## 2018-12-12 DIAGNOSIS — M5136 Other intervertebral disc degeneration, lumbar region: Secondary | ICD-10-CM | POA: Diagnosis not present

## 2018-12-12 DIAGNOSIS — M545 Low back pain: Secondary | ICD-10-CM | POA: Diagnosis not present

## 2018-12-12 DIAGNOSIS — Z79899 Other long term (current) drug therapy: Secondary | ICD-10-CM | POA: Diagnosis not present

## 2019-01-02 DIAGNOSIS — F330 Major depressive disorder, recurrent, mild: Secondary | ICD-10-CM | POA: Diagnosis not present

## 2019-01-02 DIAGNOSIS — F411 Generalized anxiety disorder: Secondary | ICD-10-CM | POA: Diagnosis not present

## 2019-01-02 DIAGNOSIS — F429 Obsessive-compulsive disorder, unspecified: Secondary | ICD-10-CM | POA: Diagnosis not present

## 2019-01-09 DIAGNOSIS — Z23 Encounter for immunization: Secondary | ICD-10-CM | POA: Diagnosis not present

## 2019-01-09 DIAGNOSIS — M545 Low back pain: Secondary | ICD-10-CM | POA: Diagnosis not present

## 2019-01-09 DIAGNOSIS — Z79899 Other long term (current) drug therapy: Secondary | ICD-10-CM | POA: Diagnosis not present

## 2019-01-09 DIAGNOSIS — M961 Postlaminectomy syndrome, not elsewhere classified: Secondary | ICD-10-CM | POA: Diagnosis not present

## 2019-01-09 DIAGNOSIS — M5136 Other intervertebral disc degeneration, lumbar region: Secondary | ICD-10-CM | POA: Diagnosis not present

## 2019-02-06 DIAGNOSIS — M5136 Other intervertebral disc degeneration, lumbar region: Secondary | ICD-10-CM | POA: Diagnosis not present

## 2019-02-06 DIAGNOSIS — M961 Postlaminectomy syndrome, not elsewhere classified: Secondary | ICD-10-CM | POA: Diagnosis not present

## 2019-02-06 DIAGNOSIS — Z79899 Other long term (current) drug therapy: Secondary | ICD-10-CM | POA: Diagnosis not present

## 2019-02-06 DIAGNOSIS — M545 Low back pain: Secondary | ICD-10-CM | POA: Diagnosis not present

## 2019-02-06 DIAGNOSIS — M546 Pain in thoracic spine: Secondary | ICD-10-CM | POA: Diagnosis not present

## 2019-03-08 DIAGNOSIS — Z1159 Encounter for screening for other viral diseases: Secondary | ICD-10-CM | POA: Diagnosis not present

## 2019-03-08 DIAGNOSIS — M545 Low back pain: Secondary | ICD-10-CM | POA: Diagnosis not present

## 2019-03-08 DIAGNOSIS — M5136 Other intervertebral disc degeneration, lumbar region: Secondary | ICD-10-CM | POA: Diagnosis not present

## 2019-03-08 DIAGNOSIS — Z79899 Other long term (current) drug therapy: Secondary | ICD-10-CM | POA: Diagnosis not present

## 2019-03-08 DIAGNOSIS — Z0000 Encounter for general adult medical examination without abnormal findings: Secondary | ICD-10-CM | POA: Diagnosis not present

## 2019-03-08 DIAGNOSIS — M546 Pain in thoracic spine: Secondary | ICD-10-CM | POA: Diagnosis not present

## 2019-03-27 DIAGNOSIS — F330 Major depressive disorder, recurrent, mild: Secondary | ICD-10-CM | POA: Diagnosis not present

## 2019-03-27 DIAGNOSIS — F411 Generalized anxiety disorder: Secondary | ICD-10-CM | POA: Diagnosis not present

## 2019-03-27 DIAGNOSIS — F429 Obsessive-compulsive disorder, unspecified: Secondary | ICD-10-CM | POA: Diagnosis not present

## 2019-04-12 DIAGNOSIS — M545 Low back pain: Secondary | ICD-10-CM | POA: Diagnosis not present

## 2019-04-12 DIAGNOSIS — R300 Dysuria: Secondary | ICD-10-CM | POA: Diagnosis not present

## 2019-04-12 DIAGNOSIS — R0600 Dyspnea, unspecified: Secondary | ICD-10-CM | POA: Diagnosis not present

## 2019-04-12 DIAGNOSIS — M129 Arthropathy, unspecified: Secondary | ICD-10-CM | POA: Diagnosis not present

## 2019-04-12 DIAGNOSIS — M5136 Other intervertebral disc degeneration, lumbar region: Secondary | ICD-10-CM | POA: Diagnosis not present

## 2019-04-12 DIAGNOSIS — Z1159 Encounter for screening for other viral diseases: Secondary | ICD-10-CM | POA: Diagnosis not present

## 2019-04-12 DIAGNOSIS — D539 Nutritional anemia, unspecified: Secondary | ICD-10-CM | POA: Diagnosis not present

## 2019-04-12 DIAGNOSIS — E7800 Pure hypercholesterolemia, unspecified: Secondary | ICD-10-CM | POA: Diagnosis not present

## 2019-04-12 DIAGNOSIS — R5383 Other fatigue: Secondary | ICD-10-CM | POA: Diagnosis not present

## 2019-04-12 DIAGNOSIS — Z79899 Other long term (current) drug therapy: Secondary | ICD-10-CM | POA: Diagnosis not present

## 2019-04-12 DIAGNOSIS — E559 Vitamin D deficiency, unspecified: Secondary | ICD-10-CM | POA: Diagnosis not present

## 2019-05-11 DIAGNOSIS — M5136 Other intervertebral disc degeneration, lumbar region: Secondary | ICD-10-CM | POA: Diagnosis not present

## 2019-05-11 DIAGNOSIS — Z79899 Other long term (current) drug therapy: Secondary | ICD-10-CM | POA: Diagnosis not present

## 2019-05-11 DIAGNOSIS — Z87891 Personal history of nicotine dependence: Secondary | ICD-10-CM | POA: Diagnosis not present

## 2019-05-11 DIAGNOSIS — M542 Cervicalgia: Secondary | ICD-10-CM | POA: Diagnosis not present

## 2019-05-11 DIAGNOSIS — M545 Low back pain: Secondary | ICD-10-CM | POA: Diagnosis not present

## 2019-05-11 DIAGNOSIS — M419 Scoliosis, unspecified: Secondary | ICD-10-CM | POA: Diagnosis not present

## 2019-05-25 DIAGNOSIS — F411 Generalized anxiety disorder: Secondary | ICD-10-CM | POA: Diagnosis not present

## 2019-05-25 DIAGNOSIS — F429 Obsessive-compulsive disorder, unspecified: Secondary | ICD-10-CM | POA: Diagnosis not present

## 2019-05-25 DIAGNOSIS — F330 Major depressive disorder, recurrent, mild: Secondary | ICD-10-CM | POA: Diagnosis not present

## 2019-06-08 DIAGNOSIS — Z79899 Other long term (current) drug therapy: Secondary | ICD-10-CM | POA: Diagnosis not present

## 2019-06-08 DIAGNOSIS — M48061 Spinal stenosis, lumbar region without neurogenic claudication: Secondary | ICD-10-CM | POA: Diagnosis not present

## 2019-06-08 DIAGNOSIS — M961 Postlaminectomy syndrome, not elsewhere classified: Secondary | ICD-10-CM | POA: Diagnosis not present

## 2019-06-08 DIAGNOSIS — R0989 Other specified symptoms and signs involving the circulatory and respiratory systems: Secondary | ICD-10-CM | POA: Diagnosis not present

## 2019-06-08 DIAGNOSIS — M545 Low back pain: Secondary | ICD-10-CM | POA: Diagnosis not present

## 2019-06-08 DIAGNOSIS — I739 Peripheral vascular disease, unspecified: Secondary | ICD-10-CM | POA: Diagnosis not present

## 2019-06-08 DIAGNOSIS — M419 Scoliosis, unspecified: Secondary | ICD-10-CM | POA: Diagnosis not present

## 2019-06-19 DIAGNOSIS — M549 Dorsalgia, unspecified: Secondary | ICD-10-CM | POA: Diagnosis not present

## 2019-06-19 DIAGNOSIS — R222 Localized swelling, mass and lump, trunk: Secondary | ICD-10-CM | POA: Diagnosis not present

## 2019-06-19 DIAGNOSIS — S3992XA Unspecified injury of lower back, initial encounter: Secondary | ICD-10-CM | POA: Diagnosis not present

## 2019-06-26 DIAGNOSIS — Z79899 Other long term (current) drug therapy: Secondary | ICD-10-CM | POA: Diagnosis not present

## 2019-06-26 DIAGNOSIS — M5136 Other intervertebral disc degeneration, lumbar region: Secondary | ICD-10-CM | POA: Diagnosis not present

## 2019-06-26 DIAGNOSIS — M419 Scoliosis, unspecified: Secondary | ICD-10-CM | POA: Diagnosis not present

## 2019-06-26 DIAGNOSIS — M545 Low back pain: Secondary | ICD-10-CM | POA: Diagnosis not present

## 2019-07-30 DIAGNOSIS — Z23 Encounter for immunization: Secondary | ICD-10-CM | POA: Diagnosis not present

## 2019-07-30 DIAGNOSIS — M419 Scoliosis, unspecified: Secondary | ICD-10-CM | POA: Diagnosis not present

## 2019-07-30 DIAGNOSIS — Z79899 Other long term (current) drug therapy: Secondary | ICD-10-CM | POA: Diagnosis not present

## 2019-07-30 DIAGNOSIS — M545 Low back pain: Secondary | ICD-10-CM | POA: Diagnosis not present

## 2019-07-30 DIAGNOSIS — M5136 Other intervertebral disc degeneration, lumbar region: Secondary | ICD-10-CM | POA: Diagnosis not present

## 2019-07-30 DIAGNOSIS — Z7189 Other specified counseling: Secondary | ICD-10-CM | POA: Diagnosis not present

## 2019-08-21 DIAGNOSIS — F429 Obsessive-compulsive disorder, unspecified: Secondary | ICD-10-CM | POA: Diagnosis not present

## 2019-08-21 DIAGNOSIS — F411 Generalized anxiety disorder: Secondary | ICD-10-CM | POA: Diagnosis not present

## 2019-08-21 DIAGNOSIS — F330 Major depressive disorder, recurrent, mild: Secondary | ICD-10-CM | POA: Diagnosis not present

## 2019-08-30 DIAGNOSIS — M545 Low back pain: Secondary | ICD-10-CM | POA: Diagnosis not present

## 2019-08-30 DIAGNOSIS — J449 Chronic obstructive pulmonary disease, unspecified: Secondary | ICD-10-CM | POA: Diagnosis not present

## 2019-08-30 DIAGNOSIS — Z79899 Other long term (current) drug therapy: Secondary | ICD-10-CM | POA: Diagnosis not present

## 2019-08-30 DIAGNOSIS — M5136 Other intervertebral disc degeneration, lumbar region: Secondary | ICD-10-CM | POA: Diagnosis not present

## 2019-08-30 DIAGNOSIS — Z1159 Encounter for screening for other viral diseases: Secondary | ICD-10-CM | POA: Diagnosis not present

## 2019-08-30 DIAGNOSIS — M419 Scoliosis, unspecified: Secondary | ICD-10-CM | POA: Diagnosis not present

## 2019-09-14 DIAGNOSIS — M419 Scoliosis, unspecified: Secondary | ICD-10-CM | POA: Diagnosis not present

## 2019-09-14 DIAGNOSIS — M48061 Spinal stenosis, lumbar region without neurogenic claudication: Secondary | ICD-10-CM | POA: Diagnosis not present

## 2019-09-14 DIAGNOSIS — M5136 Other intervertebral disc degeneration, lumbar region: Secondary | ICD-10-CM | POA: Diagnosis not present

## 2019-09-14 DIAGNOSIS — M545 Low back pain: Secondary | ICD-10-CM | POA: Diagnosis not present

## 2019-09-14 DIAGNOSIS — Z79899 Other long term (current) drug therapy: Secondary | ICD-10-CM | POA: Diagnosis not present

## 2019-10-25 DIAGNOSIS — Z1159 Encounter for screening for other viral diseases: Secondary | ICD-10-CM | POA: Diagnosis not present

## 2019-10-25 DIAGNOSIS — R220 Localized swelling, mass and lump, head: Secondary | ICD-10-CM | POA: Diagnosis not present

## 2019-10-25 DIAGNOSIS — R5383 Other fatigue: Secondary | ICD-10-CM | POA: Diagnosis not present

## 2019-10-25 DIAGNOSIS — D539 Nutritional anemia, unspecified: Secondary | ICD-10-CM | POA: Diagnosis not present

## 2019-10-25 DIAGNOSIS — F17210 Nicotine dependence, cigarettes, uncomplicated: Secondary | ICD-10-CM | POA: Diagnosis not present

## 2019-10-25 DIAGNOSIS — E559 Vitamin D deficiency, unspecified: Secondary | ICD-10-CM | POA: Diagnosis not present

## 2019-10-25 DIAGNOSIS — M129 Arthropathy, unspecified: Secondary | ICD-10-CM | POA: Diagnosis not present

## 2019-10-25 DIAGNOSIS — E7800 Pure hypercholesterolemia, unspecified: Secondary | ICD-10-CM | POA: Diagnosis not present

## 2019-10-25 DIAGNOSIS — Z79899 Other long term (current) drug therapy: Secondary | ICD-10-CM | POA: Diagnosis not present

## 2019-10-25 DIAGNOSIS — R300 Dysuria: Secondary | ICD-10-CM | POA: Diagnosis not present

## 2019-11-16 DIAGNOSIS — F330 Major depressive disorder, recurrent, mild: Secondary | ICD-10-CM | POA: Diagnosis not present

## 2019-11-16 DIAGNOSIS — F429 Obsessive-compulsive disorder, unspecified: Secondary | ICD-10-CM | POA: Diagnosis not present

## 2019-11-16 DIAGNOSIS — F411 Generalized anxiety disorder: Secondary | ICD-10-CM | POA: Diagnosis not present

## 2019-11-23 DIAGNOSIS — R221 Localized swelling, mass and lump, neck: Secondary | ICD-10-CM | POA: Diagnosis not present

## 2019-11-23 DIAGNOSIS — J441 Chronic obstructive pulmonary disease with (acute) exacerbation: Secondary | ICD-10-CM | POA: Diagnosis not present

## 2019-11-23 DIAGNOSIS — M545 Low back pain: Secondary | ICD-10-CM | POA: Diagnosis not present

## 2019-11-23 DIAGNOSIS — Z79899 Other long term (current) drug therapy: Secondary | ICD-10-CM | POA: Diagnosis not present

## 2019-11-23 DIAGNOSIS — R220 Localized swelling, mass and lump, head: Secondary | ICD-10-CM | POA: Diagnosis not present

## 2019-11-23 DIAGNOSIS — M419 Scoliosis, unspecified: Secondary | ICD-10-CM | POA: Diagnosis not present

## 2019-11-30 DIAGNOSIS — R0602 Shortness of breath: Secondary | ICD-10-CM | POA: Diagnosis not present

## 2019-11-30 DIAGNOSIS — R9389 Abnormal findings on diagnostic imaging of other specified body structures: Secondary | ICD-10-CM | POA: Diagnosis not present

## 2019-11-30 DIAGNOSIS — J189 Pneumonia, unspecified organism: Secondary | ICD-10-CM | POA: Diagnosis not present

## 2019-12-21 DIAGNOSIS — M545 Low back pain: Secondary | ICD-10-CM | POA: Diagnosis not present

## 2019-12-21 DIAGNOSIS — M5136 Other intervertebral disc degeneration, lumbar region: Secondary | ICD-10-CM | POA: Diagnosis not present

## 2019-12-21 DIAGNOSIS — Z79899 Other long term (current) drug therapy: Secondary | ICD-10-CM | POA: Diagnosis not present

## 2019-12-21 DIAGNOSIS — M419 Scoliosis, unspecified: Secondary | ICD-10-CM | POA: Diagnosis not present

## 2019-12-21 DIAGNOSIS — E559 Vitamin D deficiency, unspecified: Secondary | ICD-10-CM | POA: Diagnosis not present

## 2019-12-21 DIAGNOSIS — M5416 Radiculopathy, lumbar region: Secondary | ICD-10-CM | POA: Diagnosis not present

## 2020-01-09 DIAGNOSIS — R0600 Dyspnea, unspecified: Secondary | ICD-10-CM | POA: Diagnosis not present

## 2020-01-09 DIAGNOSIS — R9431 Abnormal electrocardiogram [ECG] [EKG]: Secondary | ICD-10-CM | POA: Diagnosis not present

## 2020-01-09 DIAGNOSIS — Z801 Family history of malignant neoplasm of trachea, bronchus and lung: Secondary | ICD-10-CM | POA: Diagnosis not present

## 2020-01-09 DIAGNOSIS — J181 Lobar pneumonia, unspecified organism: Secondary | ICD-10-CM | POA: Diagnosis not present

## 2020-01-09 DIAGNOSIS — J189 Pneumonia, unspecified organism: Secondary | ICD-10-CM | POA: Diagnosis not present

## 2020-01-09 DIAGNOSIS — I700 Atherosclerosis of aorta: Secondary | ICD-10-CM | POA: Diagnosis not present

## 2020-01-09 DIAGNOSIS — J9601 Acute respiratory failure with hypoxia: Secondary | ICD-10-CM | POA: Diagnosis not present

## 2020-01-09 DIAGNOSIS — J441 Chronic obstructive pulmonary disease with (acute) exacerbation: Secondary | ICD-10-CM | POA: Diagnosis not present

## 2020-01-09 DIAGNOSIS — R4020 Unspecified coma: Secondary | ICD-10-CM | POA: Diagnosis not present

## 2020-01-09 DIAGNOSIS — R55 Syncope and collapse: Secondary | ICD-10-CM | POA: Diagnosis not present

## 2020-01-09 DIAGNOSIS — Z79899 Other long term (current) drug therapy: Secondary | ICD-10-CM | POA: Diagnosis not present

## 2020-01-09 DIAGNOSIS — Z981 Arthrodesis status: Secondary | ICD-10-CM | POA: Diagnosis not present

## 2020-01-09 DIAGNOSIS — G894 Chronic pain syndrome: Secondary | ICD-10-CM | POA: Diagnosis not present

## 2020-01-09 DIAGNOSIS — Z20822 Contact with and (suspected) exposure to covid-19: Secondary | ICD-10-CM | POA: Diagnosis not present

## 2020-01-09 DIAGNOSIS — Z8249 Family history of ischemic heart disease and other diseases of the circulatory system: Secondary | ICD-10-CM | POA: Diagnosis not present

## 2020-01-09 DIAGNOSIS — R778 Other specified abnormalities of plasma proteins: Secondary | ICD-10-CM | POA: Diagnosis not present

## 2020-01-09 DIAGNOSIS — F17210 Nicotine dependence, cigarettes, uncomplicated: Secondary | ICD-10-CM | POA: Diagnosis not present

## 2020-01-09 DIAGNOSIS — R0602 Shortness of breath: Secondary | ICD-10-CM | POA: Diagnosis not present

## 2020-01-09 DIAGNOSIS — J439 Emphysema, unspecified: Secondary | ICD-10-CM | POA: Diagnosis not present

## 2020-01-09 DIAGNOSIS — M545 Low back pain: Secondary | ICD-10-CM | POA: Diagnosis not present

## 2020-01-09 DIAGNOSIS — F411 Generalized anxiety disorder: Secondary | ICD-10-CM | POA: Diagnosis not present

## 2020-01-09 DIAGNOSIS — I248 Other forms of acute ischemic heart disease: Secondary | ICD-10-CM | POA: Diagnosis not present

## 2020-01-09 DIAGNOSIS — J9690 Respiratory failure, unspecified, unspecified whether with hypoxia or hypercapnia: Secondary | ICD-10-CM | POA: Diagnosis not present

## 2020-01-09 DIAGNOSIS — F329 Major depressive disorder, single episode, unspecified: Secondary | ICD-10-CM | POA: Diagnosis not present

## 2020-01-18 DIAGNOSIS — M5416 Radiculopathy, lumbar region: Secondary | ICD-10-CM | POA: Diagnosis not present

## 2020-01-18 DIAGNOSIS — M545 Low back pain: Secondary | ICD-10-CM | POA: Diagnosis not present

## 2020-01-18 DIAGNOSIS — Z09 Encounter for follow-up examination after completed treatment for conditions other than malignant neoplasm: Secondary | ICD-10-CM | POA: Diagnosis not present

## 2020-01-18 DIAGNOSIS — Z79899 Other long term (current) drug therapy: Secondary | ICD-10-CM | POA: Diagnosis not present

## 2020-01-18 DIAGNOSIS — M419 Scoliosis, unspecified: Secondary | ICD-10-CM | POA: Diagnosis not present

## 2020-01-24 DIAGNOSIS — Z20822 Contact with and (suspected) exposure to covid-19: Secondary | ICD-10-CM | POA: Diagnosis not present

## 2020-01-24 DIAGNOSIS — Z8249 Family history of ischemic heart disease and other diseases of the circulatory system: Secondary | ICD-10-CM | POA: Diagnosis not present

## 2020-01-24 DIAGNOSIS — Z9181 History of falling: Secondary | ICD-10-CM | POA: Diagnosis not present

## 2020-01-24 DIAGNOSIS — J189 Pneumonia, unspecified organism: Secondary | ICD-10-CM | POA: Diagnosis not present

## 2020-01-24 DIAGNOSIS — F419 Anxiety disorder, unspecified: Secondary | ICD-10-CM | POA: Diagnosis not present

## 2020-01-24 DIAGNOSIS — Z7951 Long term (current) use of inhaled steroids: Secondary | ICD-10-CM | POA: Diagnosis not present

## 2020-01-24 DIAGNOSIS — G9341 Metabolic encephalopathy: Secondary | ICD-10-CM | POA: Diagnosis not present

## 2020-01-24 DIAGNOSIS — E876 Hypokalemia: Secondary | ICD-10-CM | POA: Diagnosis not present

## 2020-01-24 DIAGNOSIS — R531 Weakness: Secondary | ICD-10-CM | POA: Diagnosis not present

## 2020-01-24 DIAGNOSIS — R0902 Hypoxemia: Secondary | ICD-10-CM | POA: Diagnosis not present

## 2020-01-24 DIAGNOSIS — J440 Chronic obstructive pulmonary disease with acute lower respiratory infection: Secondary | ICD-10-CM | POA: Diagnosis not present

## 2020-01-24 DIAGNOSIS — K529 Noninfective gastroenteritis and colitis, unspecified: Secondary | ICD-10-CM | POA: Diagnosis not present

## 2020-01-24 DIAGNOSIS — J441 Chronic obstructive pulmonary disease with (acute) exacerbation: Secondary | ICD-10-CM | POA: Diagnosis not present

## 2020-01-24 DIAGNOSIS — Z79891 Long term (current) use of opiate analgesic: Secondary | ICD-10-CM | POA: Diagnosis not present

## 2020-01-24 DIAGNOSIS — T84498A Other mechanical complication of other internal orthopedic devices, implants and grafts, initial encounter: Secondary | ICD-10-CM | POA: Diagnosis not present

## 2020-01-24 DIAGNOSIS — R509 Fever, unspecified: Secondary | ICD-10-CM | POA: Diagnosis not present

## 2020-01-24 DIAGNOSIS — R0602 Shortness of breath: Secondary | ICD-10-CM | POA: Diagnosis not present

## 2020-01-24 DIAGNOSIS — Z981 Arthrodesis status: Secondary | ICD-10-CM | POA: Diagnosis not present

## 2020-01-24 DIAGNOSIS — G4700 Insomnia, unspecified: Secondary | ICD-10-CM | POA: Diagnosis not present

## 2020-01-24 DIAGNOSIS — G8929 Other chronic pain: Secondary | ICD-10-CM | POA: Diagnosis not present

## 2020-01-24 DIAGNOSIS — A419 Sepsis, unspecified organism: Secondary | ICD-10-CM | POA: Diagnosis not present

## 2020-01-24 DIAGNOSIS — F329 Major depressive disorder, single episode, unspecified: Secondary | ICD-10-CM | POA: Diagnosis not present

## 2020-01-24 DIAGNOSIS — Z79899 Other long term (current) drug therapy: Secondary | ICD-10-CM | POA: Diagnosis not present

## 2020-01-24 DIAGNOSIS — J9601 Acute respiratory failure with hypoxia: Secondary | ICD-10-CM | POA: Diagnosis not present

## 2020-01-24 DIAGNOSIS — R55 Syncope and collapse: Secondary | ICD-10-CM | POA: Diagnosis not present

## 2020-01-24 DIAGNOSIS — F17210 Nicotine dependence, cigarettes, uncomplicated: Secondary | ICD-10-CM | POA: Diagnosis not present

## 2020-01-24 DIAGNOSIS — K5900 Constipation, unspecified: Secondary | ICD-10-CM | POA: Diagnosis not present

## 2020-01-24 DIAGNOSIS — G9340 Encephalopathy, unspecified: Secondary | ICD-10-CM | POA: Diagnosis not present

## 2020-01-24 DIAGNOSIS — R6520 Severe sepsis without septic shock: Secondary | ICD-10-CM | POA: Diagnosis not present

## 2020-01-24 DIAGNOSIS — M546 Pain in thoracic spine: Secondary | ICD-10-CM | POA: Diagnosis not present

## 2020-01-24 DIAGNOSIS — M549 Dorsalgia, unspecified: Secondary | ICD-10-CM | POA: Diagnosis not present

## 2020-01-24 DIAGNOSIS — J439 Emphysema, unspecified: Secondary | ICD-10-CM | POA: Diagnosis not present

## 2020-01-24 DIAGNOSIS — R519 Headache, unspecified: Secondary | ICD-10-CM | POA: Diagnosis not present

## 2020-01-24 DIAGNOSIS — R000 Tachycardia, unspecified: Secondary | ICD-10-CM | POA: Diagnosis not present

## 2020-01-24 DIAGNOSIS — T50901A Poisoning by unspecified drugs, medicaments and biological substances, accidental (unintentional), initial encounter: Secondary | ICD-10-CM | POA: Diagnosis not present

## 2020-02-08 DIAGNOSIS — F411 Generalized anxiety disorder: Secondary | ICD-10-CM | POA: Diagnosis not present

## 2020-02-08 DIAGNOSIS — F429 Obsessive-compulsive disorder, unspecified: Secondary | ICD-10-CM | POA: Diagnosis not present

## 2020-02-08 DIAGNOSIS — F330 Major depressive disorder, recurrent, mild: Secondary | ICD-10-CM | POA: Diagnosis not present

## 2020-02-09 ENCOUNTER — Other Ambulatory Visit: Payer: Self-pay

## 2020-02-09 ENCOUNTER — Emergency Department (HOSPITAL_BASED_OUTPATIENT_CLINIC_OR_DEPARTMENT_OTHER): Payer: PPO

## 2020-02-09 ENCOUNTER — Inpatient Hospital Stay (HOSPITAL_BASED_OUTPATIENT_CLINIC_OR_DEPARTMENT_OTHER)
Admission: EM | Admit: 2020-02-09 | Discharge: 2020-02-12 | DRG: 871 | Disposition: A | Discharge status: Home / Self Care | Payer: PPO | Attending: Internal Medicine | Admitting: Internal Medicine

## 2020-02-09 ENCOUNTER — Encounter (HOSPITAL_BASED_OUTPATIENT_CLINIC_OR_DEPARTMENT_OTHER): Payer: Self-pay | Admitting: Emergency Medicine

## 2020-02-09 DIAGNOSIS — E872 Acidosis, unspecified: Secondary | ICD-10-CM

## 2020-02-09 DIAGNOSIS — R0902 Hypoxemia: Secondary | ICD-10-CM | POA: Diagnosis not present

## 2020-02-09 DIAGNOSIS — A498 Other bacterial infections of unspecified site: Secondary | ICD-10-CM

## 2020-02-09 DIAGNOSIS — J189 Pneumonia, unspecified organism: Secondary | ICD-10-CM | POA: Diagnosis present

## 2020-02-09 DIAGNOSIS — F419 Anxiety disorder, unspecified: Secondary | ICD-10-CM | POA: Diagnosis not present

## 2020-02-09 DIAGNOSIS — Z87891 Personal history of nicotine dependence: Secondary | ICD-10-CM

## 2020-02-09 DIAGNOSIS — Z981 Arthrodesis status: Secondary | ICD-10-CM | POA: Diagnosis not present

## 2020-02-09 DIAGNOSIS — A419 Sepsis, unspecified organism: Principal | ICD-10-CM

## 2020-02-09 DIAGNOSIS — Z7951 Long term (current) use of inhaled steroids: Secondary | ICD-10-CM

## 2020-02-09 DIAGNOSIS — F32A Depression, unspecified: Secondary | ICD-10-CM

## 2020-02-09 DIAGNOSIS — R5383 Other fatigue: Secondary | ICD-10-CM

## 2020-02-09 DIAGNOSIS — Y95 Nosocomial condition: Secondary | ICD-10-CM | POA: Diagnosis present

## 2020-02-09 DIAGNOSIS — K529 Noninfective gastroenteritis and colitis, unspecified: Secondary | ICD-10-CM

## 2020-02-09 DIAGNOSIS — F339 Major depressive disorder, recurrent, unspecified: Secondary | ICD-10-CM | POA: Diagnosis not present

## 2020-02-09 DIAGNOSIS — I248 Other forms of acute ischemic heart disease: Secondary | ICD-10-CM | POA: Diagnosis present

## 2020-02-09 DIAGNOSIS — J441 Chronic obstructive pulmonary disease with (acute) exacerbation: Secondary | ICD-10-CM | POA: Diagnosis not present

## 2020-02-09 DIAGNOSIS — K5130 Ulcerative (chronic) rectosigmoiditis without complications: Secondary | ICD-10-CM | POA: Diagnosis not present

## 2020-02-09 DIAGNOSIS — N390 Urinary tract infection, site not specified: Secondary | ICD-10-CM | POA: Diagnosis not present

## 2020-02-09 DIAGNOSIS — M549 Dorsalgia, unspecified: Secondary | ICD-10-CM | POA: Diagnosis present

## 2020-02-09 DIAGNOSIS — I700 Atherosclerosis of aorta: Secondary | ICD-10-CM | POA: Diagnosis not present

## 2020-02-09 DIAGNOSIS — R000 Tachycardia, unspecified: Secondary | ICD-10-CM | POA: Diagnosis not present

## 2020-02-09 DIAGNOSIS — Z20822 Contact with and (suspected) exposure to covid-19: Secondary | ICD-10-CM | POA: Diagnosis present

## 2020-02-09 DIAGNOSIS — E876 Hypokalemia: Secondary | ICD-10-CM | POA: Diagnosis not present

## 2020-02-09 DIAGNOSIS — R0602 Shortness of breath: Secondary | ICD-10-CM | POA: Diagnosis not present

## 2020-02-09 DIAGNOSIS — J439 Emphysema, unspecified: Secondary | ICD-10-CM | POA: Diagnosis present

## 2020-02-09 DIAGNOSIS — G8929 Other chronic pain: Secondary | ICD-10-CM | POA: Diagnosis present

## 2020-02-09 HISTORY — DX: Depression, unspecified: F32.A

## 2020-02-09 HISTORY — DX: Emphysema, unspecified: J43.9

## 2020-02-09 HISTORY — DX: Anxiety disorder, unspecified: F41.9

## 2020-02-09 LAB — URINALYSIS, ROUTINE W REFLEX MICROSCOPIC
Bilirubin Urine: NEGATIVE
Glucose, UA: NEGATIVE mg/dL
Hgb urine dipstick: NEGATIVE
Ketones, ur: NEGATIVE mg/dL
Nitrite: NEGATIVE
Protein, ur: NEGATIVE mg/dL
Specific Gravity, Urine: 1.02 (ref 1.005–1.030)
pH: 5.5 (ref 5.0–8.0)

## 2020-02-09 LAB — POCT I-STAT EG7
Acid-Base Excess: 4 mmol/L — ABNORMAL HIGH (ref 0.0–2.0)
Bicarbonate: 31.2 mmol/L — ABNORMAL HIGH (ref 20.0–28.0)
Calcium, Ion: 1.19 mmol/L (ref 1.15–1.40)
HCT: 49 % — ABNORMAL HIGH (ref 36.0–46.0)
Hemoglobin: 16.7 g/dL — ABNORMAL HIGH (ref 12.0–15.0)
O2 Saturation: 17 %
Patient temperature: 100.2
Potassium: 4.5 mmol/L (ref 3.5–5.1)
Sodium: 137 mmol/L (ref 135–145)
TCO2: 33 mmol/L — ABNORMAL HIGH (ref 22–32)
pCO2, Ven: 59.8 mmHg (ref 44.0–60.0)
pH, Ven: 7.33 (ref 7.250–7.430)
pO2, Ven: 16 mmHg — CL (ref 32.0–45.0)

## 2020-02-09 LAB — BASIC METABOLIC PANEL
Anion gap: 12 (ref 5–15)
BUN: 19 mg/dL (ref 6–20)
CO2: 27 mmol/L (ref 22–32)
Calcium: 9 mg/dL (ref 8.9–10.3)
Chloride: 98 mmol/L (ref 98–111)
Creatinine, Ser: 0.98 mg/dL (ref 0.44–1.00)
GFR calc Af Amer: >60 mL/min (ref >60)
GFR calc non Af Amer: >60 mL/min (ref >60)
Glucose, Bld: 118 mg/dL — ABNORMAL HIGH (ref 70–99)
Potassium: 3.5 mmol/L (ref 3.5–5.1)
Sodium: 137 mmol/L (ref 135–145)

## 2020-02-09 LAB — HEPATIC FUNCTION PANEL
ALT: 13 U/L (ref 0–44)
AST: 25 U/L (ref 15–41)
Albumin: 3.5 g/dL (ref 3.5–5.0)
Alkaline Phosphatase: 81 U/L (ref 38–126)
Bilirubin, Direct: <0.1 mg/dL (ref 0.0–0.2)
Total Bilirubin: 0.9 mg/dL (ref 0.3–1.2)
Total Protein: 8.2 g/dL — ABNORMAL HIGH (ref 6.5–8.1)

## 2020-02-09 LAB — URINALYSIS, MICROSCOPIC (REFLEX)

## 2020-02-09 LAB — CBC
HCT: 46.1 % — ABNORMAL HIGH (ref 36.0–46.0)
Hemoglobin: 15.5 g/dL — ABNORMAL HIGH (ref 12.0–15.0)
MCH: 29.1 pg (ref 26.0–34.0)
MCHC: 33.6 g/dL (ref 30.0–36.0)
MCV: 86.5 fL (ref 80.0–100.0)
Platelets: 390 10*3/uL (ref 150–400)
RBC: 5.33 MIL/uL — ABNORMAL HIGH (ref 3.87–5.11)
RDW: 14.9 % (ref 11.5–15.5)
WBC: 9 10*3/uL (ref 4.0–10.5)
nRBC: 0 % (ref 0.0–0.2)

## 2020-02-09 LAB — PREGNANCY, URINE: Preg Test, Ur: NEGATIVE

## 2020-02-09 LAB — RAPID URINE DRUG SCREEN, HOSP PERFORMED
Amphetamines: NOT DETECTED
Barbiturates: NOT DETECTED
Benzodiazepines: POSITIVE — AB
Cocaine: NOT DETECTED
Opiates: NOT DETECTED
Tetrahydrocannabinol: NOT DETECTED

## 2020-02-09 LAB — TROPONIN I (HIGH SENSITIVITY)
Troponin I (High Sensitivity): 29 ng/L — ABNORMAL HIGH (ref <18)
Troponin I (High Sensitivity): 21 ng/L — ABNORMAL HIGH (ref <18)

## 2020-02-09 LAB — CBG MONITORING, ED: Glucose-Capillary: 101 mg/dL — ABNORMAL HIGH (ref 70–99)

## 2020-02-09 LAB — LACTIC ACID, PLASMA
Lactic Acid, Venous: 2.1 mmol/L (ref 0.5–1.9)
Lactic Acid, Venous: 1.4 mmol/L (ref 0.5–1.9)

## 2020-02-09 LAB — AMMONIA: Ammonia: 22 umol/L (ref 9–35)

## 2020-02-09 LAB — SARS CORONAVIRUS 2 BY RT PCR (HOSPITAL ORDER, PERFORMED IN ~~LOC~~ HOSPITAL LAB): SARS Coronavirus 2: NEGATIVE

## 2020-02-09 MED ORDER — SODIUM CHLORIDE 0.9 % IV SOLN
1.0000 g | Freq: Once | INTRAVENOUS | Status: DC
  Filled 2020-02-09: qty 1

## 2020-02-09 MED ORDER — NALOXONE HCL 0.4 MG/ML IJ SOLN
0.4000 mg | Freq: Once | INTRAMUSCULAR | Status: AC
  Administered 2020-02-09: 0.4 mg via INTRAVENOUS
  Filled 2020-02-09: qty 1

## 2020-02-09 MED ORDER — ACETAMINOPHEN 500 MG PO TABS
1000.0000 mg | ORAL_TABLET | Freq: Once | ORAL | Status: AC
  Administered 2020-02-09: 1000 mg via ORAL
  Filled 2020-02-09: qty 2

## 2020-02-09 MED ORDER — ALBUTEROL SULFATE HFA 108 (90 BASE) MCG/ACT IN AERS: 2.0000 | INHALATION_SPRAY | RESPIRATORY_TRACT | Status: DC | PRN

## 2020-02-09 MED ORDER — IOHEXOL 350 MG/ML SOLN
100.0000 mL | Freq: Once | INTRAVENOUS | Status: AC | PRN
  Administered 2020-02-09: 100 mL via INTRAVENOUS

## 2020-02-09 MED ORDER — VANCOMYCIN HCL IN DEXTROSE 1-5 GM/200ML-% IV SOLN
1000.0000 mg | Freq: Once | INTRAVENOUS | Status: AC
  Administered 2020-02-10: 1000 mg via INTRAVENOUS
  Filled 2020-02-09: qty 200

## 2020-02-09 MED ORDER — SODIUM CHLORIDE 0.9 % IV BOLUS
1000.0000 mL | Freq: Once | INTRAVENOUS | Status: AC
  Administered 2020-02-09: 1000 mL via INTRAVENOUS

## 2020-02-09 NOTE — ED Triage Notes (Signed)
Pt c/o SOB, generalized weakness since yesterday. Recently admitted for pneumonia.

## 2020-02-09 NOTE — Progress Notes (Signed)
Pharmacy Antibiotic Note  Jennifer Mcdaniel is a 59 y.o. female admitted on 02/09/2020 with pneumonia.  Pharmacy has been consulted for vancomycin dosing.  Plan: Vancomycin 1gm IV x 1  F/u disposition  Height: 5\' 3"  (160 cm) Weight: 48.1 kg (106 lb) IBW/kg (Calculated) : 52.4  Temp (24hrs), Avg:98.5 F (36.9 C), Min:97.5 F (36.4 C), Max:100.2 F (37.9 C)  Recent Labs  Lab 02/09/20 1819 02/09/20 1923 02/09/20 2110  WBC 9.0  --   --   CREATININE 0.98  --   --   LATICACIDVEN  --  2.1* 1.4    Estimated Creatinine Clearance: 48.1 mL/min (by C-G formula based on SCr of 0.98 mg/dL).    No Known Allergies  Thank you for allowing pharmacy to be a part of this patient's care.  [id number] 02/09/2020 11:46 PM

## 2020-02-09 NOTE — ED Notes (Signed)
Update provided to husband Joe - provider Lillia Abed talking to husband now.

## 2020-02-09 NOTE — ED Notes (Signed)
In room to update med list.  Patient very somnolent.  Will wake up for a second but falls back to sleep quickly.  Unable to get list.

## 2020-02-09 NOTE — ED Provider Notes (Signed)
[facility] EMERGENCY DEPARTMENT Provider Note   CSN: [id number] Arrival date & time: 02/09/20  1739     History Chief Complaint  Patient presents with  . Shortness of Breath  . Weakness    Jennifer Mcdaniel is a 59 y.o. female past mostly of pulmonary emphysema who presents for evaluation of shortness of breath, generalized weakness, fatigue that began yesterday.  Patient reports she had a follow-up appointment with her primary care doctor yesterday.  She reports that she felt fine.  He reports that when she went home, she started feeling very fatigued and very tired.  Patient states she felt like she did not have any energy and had generalized weakness.  She denies any focal weakness.  Patient reports that she also fell like she was having some trouble breathing and some mild cough.  She did not note any fever.  Patient feels like she is very sleepy.  She felt like today, she did not want to eat when is having nausea.  She has had 1 loose stool.  She also has had some intermittent abdominal pain.  She denies any chest pain, difficulty urinating, dysuria, hematuria.  She did get vaccine for Covid.  She does not know of any known Covid exposure.  He admitted though.  I also discussed with patient's husband (Joe).  He states that patient had had some intermittent fatigue over the last few weeks after being discharged but had relatively been back to baseline.  He reports that she saw her psychologist yesterday when she came home, she was complaining of muscle aches, fatigue and tired.  He reports that over the next 24 hours, she was more lethargic and more fatigued.  He reports that he had to continuously wake her up and she would be somnolent and sometimes confused.  He had not noted any fever.  Patient also been complaining of difficulty breathing.  Patient does take Xanax but patient's husband does not think that she has been taking more.  He also reports that she has been prescribed  oxycodone and OxyContin but patient has not been taking that.  The history is provided by the patient.       Past Medical History:  Diagnosis Date  . Anxiety   . Depression   . Pulmonary emphysema [facility])     Patient Active Problem List   Diagnosis Date Noted  . Pneumonia 02/10/2020     The histories are not reviewed yet. Please review them in the "History" navigator section and refresh this SmartLink.   OB History   No obstetric history on file.     No family history on file.  Social History   Tobacco Use  . Smoking status: Not on file  Substance Use Topics  . Alcohol use: Not on file  . Drug use: Not on file    Home Medications Prior to Admission medications   Medication Sig Start Date End Date Taking? Authorizing Provider  albuterol (VENTOLIN HFA) 108 (90 Base) MCG/ACT inhaler INHALE 2 PUFFS INTO THE LUNGS EVERY 4 HOURS AS NEEDED 01/12/20  Yes [provider]  ALPRAZolam (XANAX) 0.5 MG tablet Take 0.5 mg by mouth 4 (four) times daily. 02/08/20  Yes [provider]  ARIPiprazole (ABILIFY) 5 MG tablet Take 5 mg by mouth daily. 11/16/19  Yes [provider]  budesonide-formoterol (SYMBICORT) 160-4.5 MCG/ACT inhaler Inhale into the lungs. 01/12/20 02/11/20 Yes [provider]  buPROPion (WELLBUTRIN XL) 300 MG 24 hr tablet Take by mouth.  02/08/20  Yes [provider]  DULoxetine (CYMBALTA) 60 MG capsule Take 120 mg by mouth daily. 01/17/20  Yes [provider]  EC-NAPROXEN 500 MG EC tablet Take 500 mg by mouth 2 (two) times daily. 11/14/19  Yes [provider]  nicotine (NICODERM CQ - DOSED IN MG/24 HOURS) 21 mg/24hr patch 21 mg daily. 01/12/20  Yes [provider]  oxyCODONE (OXYCONTIN) 20 mg 12 hr tablet Take by mouth. 10/30/19  Yes [provider]  Oxycodone HCl 10 MG TABS Take by mouth. 08/09/15  Yes [provider]  pregabalin (LYRICA) 200 MG capsule Take 200 mg by mouth 3 (three) times  daily. 01/30/20  Yes [provider]  tiZANidine (ZANAFLEX) 4 MG tablet Take by mouth. 03/02/13  Yes [provider]  famotidine (PEPCID) 20 MG tablet Take 20 mg by mouth 2 (two) times daily. 01/28/20   [provider]  Vitamin D, Ergocalciferol, (DRISDOL) 1.25 MG (50000 UNIT) CAPS capsule Take 50,000 Units by mouth once a week. 10/29/19   [provider]    Allergies    Patient has no known allergies.  Review of Systems   Review of Systems  Constitutional: Positive for appetite change and fatigue. Negative for fever.  Respiratory: Positive for shortness of breath. Negative for cough.   Cardiovascular: Negative for chest pain.  Gastrointestinal: Positive for abdominal pain and nausea. Negative for vomiting.  Genitourinary: Negative for dysuria and hematuria.  Neurological: Positive for weakness (Generalized). Negative for headaches.  All other systems reviewed and are negative.   Physical Exam Updated Vital Signs BP (!) 86/55 (BP Location: Right Arm)   Pulse (!) 107   Temp 100.2 F (37.9 C) (Rectal)   Resp 19   Ht 5\' 3"  (1.6 m)   Wt 48.1 kg   SpO2 97%   BMI 18.78 kg/m   Physical Exam Vitals and nursing note reviewed.  Constitutional:      Appearance: Normal appearance. She is well-developed.     Comments: Frail, elderly appearing. Appears drowsy but easily arousable.   HENT:     Head: Normocephalic and atraumatic.  Eyes:     General: Lids are normal.     Conjunctiva/sclera: Conjunctivae normal.     Pupils: Pupils are equal, round, and reactive to light.     Comments: PERRL. EOMs intact. No nystagmus. No neglect.    Cardiovascular:     Rate and Rhythm: Normal rate and regular rhythm.     Pulses: Normal pulses.     Heart sounds: Normal heart sounds. No murmur. No friction rub. No gallop.   Pulmonary:     Effort: Pulmonary effort is normal.     Breath sounds: Normal breath sounds.     Comments: Lungs clear to auscultation bilaterally.   Symmetric chest rise.  No wheezing, rales, rhonchi. Abdominal:     Palpations: Abdomen is soft. Abdomen is not rigid.     Tenderness: There is abdominal tenderness. There is right CVA tenderness. There is no guarding.     Comments: Abdomen is soft, non-distended. Mild generalized tenderness. No rigidity, guarding. Mild right sided CVA tenderness. No Left sided CVA tenderness.   Musculoskeletal:        General: Normal range of motion.     Cervical back: Full passive range of motion without pain.  Skin:    General: Skin is warm and dry.     Capillary Refill: Capillary refill takes less than 2 seconds.     Coloration: Skin is  pale.  Neurological:     Mental Status: She is alert and oriented to person, place, and time.     Comments: Alert and oriented x 3  Cranial nerves III-XII intact Follows commands, Moves all extremities  5/5 strength to BUE and BLE  Sensation intact throughout all major nerve distributions  No slurred speech. No facial droop.   Psychiatric:        Speech: Speech normal.     ED Results / Procedures / Treatments   Labs (all labs ordered are listed, but only abnormal results are displayed) Labs Reviewed  BASIC METABOLIC PANEL - Abnormal; Notable for the following components:      Result Value   Glucose, Bld 118 (*)    All other components within normal limits  CBC - Abnormal; Notable for the following components:   RBC 5.33 (*)    Hemoglobin 15.5 (*)    HCT 46.1 (*)    All other components within normal limits  URINALYSIS, ROUTINE W REFLEX MICROSCOPIC - Abnormal; Notable for the following components:   Leukocytes,Ua TRACE (*)    All other components within normal limits  LACTIC ACID, PLASMA - Abnormal; Notable for the following components:   Lactic Acid, Venous 2.1 (*)    All other components within normal limits  HEPATIC FUNCTION PANEL - Abnormal; Notable for the following components:   Total Protein 8.2 (*)    All other components within normal limits    URINALYSIS, MICROSCOPIC (REFLEX) - Abnormal; Notable for the following components:   Bacteria, UA MANY (*)    All other components within normal limits  RAPID URINE DRUG SCREEN, HOSP PERFORMED - Abnormal; Notable for the following components:   Benzodiazepines POSITIVE (*)    All other components within normal limits  POCT I-STAT EG7 - Abnormal; Notable for the following components:   pO2, Ven 16.0 (*)    Bicarbonate 31.2 (*)    TCO2 33 (*)    Acid-Base Excess 4.0 (*)    HCT 49.0 (*)    Hemoglobin 16.7 (*)    All other components within normal limits  CBG MONITORING, ED - Abnormal; Notable for the following components:   Glucose-Capillary 101 (*)    All other components within normal limits  TROPONIN I (HIGH SENSITIVITY) - Abnormal; Notable for the following components:   Troponin I (High Sensitivity) 29 (*)    All other components within normal limits  TROPONIN I (HIGH SENSITIVITY) - Abnormal; Notable for the following components:   Troponin I (High Sensitivity) 21 (*)    All other components within normal limits  SARS CORONAVIRUS 2 BY RT PCR (HOSPITAL ORDER, PERFORMED IN [facility])  LACTIC ACID, PLASMA  PREGNANCY, URINE  AMMONIA    EKG EKG Interpretation  Date/Time:  Saturday Feb 09 2020 18:10:02 EDT Ventricular Rate:  121 PR Interval:  132 QRS Duration: 68 QT Interval:  334 QTC Calculation: 474 R Axis:   -13 Text Interpretation: Sinus tachycardia Biatrial enlargement Anteroseptal infarct , age undetermined Abnormal ECG no prior available for comparison Confirmed by Tilden Fossa [phone]) on 02/09/2020 6:57:34 PM   Radiology CT Angio Chest PE W and/or Wo Contrast  Result Date: 02/09/2020 CLINICAL DATA:  Shortness of breath and abdominal pain EXAM: CT ANGIOGRAPHY CHEST WITH CONTRAST TECHNIQUE: Multidetector CT imaging of the chest was performed using the standard protocol during bolus administration of intravenous contrast. Multiplanar CT image  reconstructions and MIPs were obtained to evaluate the vascular anatomy. CONTRAST:  OMNIPAQUE  IOHEXOL 350 MG/ML SOLN COMPARISON:  January 09, 2020 FINDINGS: Cardiovascular: There is a optimal opacification of the pulmonary arteries. There is no central,segmental, or subsegmental filling defects within the pulmonary arteries. The heart is normal in size. No pericardial effusion or thickening. No evidence right heart strain. There is normal three-vessel brachiocephalic anatomy without proximal stenosis. The thoracic aorta is normal in appearance. Mediastinum/Nodes: No hilar, mediastinal, or axillary adenopathy. Thyroid gland, trachea, and esophagus demonstrate no significant findings. Lungs/Pleura: Centrilobular emphysematous changes are seen throughout both lungs. There interval slight worsening of the reticulonodular small tree-in-bud opacities at the posterior right peripheral lung base. There is slight interval improvement however in the right middle lobe tree-in-bud opacities. The left lung is otherwise clear. No pleural effusion. Upper Abdomen: No acute abnormalities present in the visualized portions of the upper abdomen. Musculoskeletal: No chest wall abnormality. No acute or significant osseous findings. Again noted is a scoliotic curvature with posterior spinal rod fixation. Review of the MIP images confirms the above findings. Abdomen/pelvis: Hepatobiliary: The liver is normal in density without focal abnormality.The main portal vein is patent. No evidence of calcified gallstones, gallbladder wall thickening or biliary dilatation. Pancreas: Unremarkable. No pancreatic ductal dilatation or surrounding inflammatory changes. Spleen: Normal in size without focal abnormality. Adrenals/Urinary Tract: Both adrenal glands appear normal. The kidneys and collecting system appear normal without evidence of urinary tract calculus or hydronephrosis. Bladder is unremarkable. Stomach/Bowel: The stomach is  unremarkable. There is a air-filled duodenal diverticulum seen at the posterior third portion of the duodenum. The remainder of the small bowel is grossly unremarkable. There appears to be mild wall thickening with hyperenhancement seen of the sigmoid colon and rectum with question of mild mesenteric surrounding edema. No definite surrounding free air or loculated fluid collections however are noted. Vascular/Lymphatic: There are no enlarged mesenteric, retroperitoneal, or pelvic lymph nodes. Scattered aortic atherosclerotic calcifications are seen without aneurysmal dilatation. Reproductive: The uterus and adnexa are unremarkable. Other: No evidence of abdominal wall mass or hernia. Musculoskeletal: Posterior spinal rod fixation from scoliotic surgery seen. There is anterior interbody fusion at L5-S1. There appears to be a significant periprosthetic lucency seen around the left sacroiliac screw measuring up to 8 mm. IMPRESSION: 1. No central, segmental, or subsegmental pulmonary embolism. 2. Mild new reticulonodular/tree-in-bud opacities at the posterior peripheral right lung base in comparison to the prior exam which could be due to an infectious or inflammatory process. 3.  Emphysema (ICD10-J43.9). 4. 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 No surrounding free air or loculated fluid collections. No evidence of obstruction 5. 8 mm periprosthetic lucency around the left sacroiliac screw which could be due to loosening. 6.  Aortic Atherosclerosis (ICD10-I70.0). Electronically Signed   By: Jonna Clark M.D.   On: 02/09/2020 23:11   CT ABDOMEN PELVIS W CONTRAST  Result Date: 02/09/2020 CLINICAL DATA:  Shortness of breath and abdominal pain EXAM: CT ANGIOGRAPHY CHEST WITH CONTRAST TECHNIQUE: Multidetector CT imaging of the chest was performed using the standard protocol during bolus administration of intravenous contrast.  Multiplanar CT image reconstructions and MIPs were obtained to evaluate the vascular anatomy. CONTRAST:  OMNIPAQUE IOHEXOL 350 MG/ML SOLN COMPARISON:  January 09, 2020 FINDINGS: Cardiovascular: There is a optimal opacification of the pulmonary arteries. There is no central,segmental, or subsegmental filling defects within the pulmonary arteries. The heart is normal in size. No pericardial effusion or thickening. No evidence right heart strain. There is normal three-vessel  brachiocephalic anatomy without proximal stenosis. The thoracic aorta is normal in appearance. Mediastinum/Nodes: No hilar, mediastinal, or axillary adenopathy. Thyroid gland, trachea, and esophagus demonstrate no significant findings. Lungs/Pleura: Centrilobular emphysematous changes are seen throughout both lungs. There interval slight worsening of the reticulonodular small tree-in-bud opacities at the posterior right peripheral lung base. There is slight interval improvement however in the right middle lobe tree-in-bud opacities. The left lung is otherwise clear. No pleural effusion. Upper Abdomen: No acute abnormalities present in the visualized portions of the upper abdomen. Musculoskeletal: No chest wall abnormality. No acute or significant osseous findings. Again noted is a scoliotic curvature with posterior spinal rod fixation. Review of the MIP images confirms the above findings. Abdomen/pelvis: Hepatobiliary: The liver is normal in density without focal abnormality.The main portal vein is patent. No evidence of calcified gallstones, gallbladder wall thickening or biliary dilatation. Pancreas: Unremarkable. No pancreatic ductal dilatation or surrounding inflammatory changes. Spleen: Normal in size without focal abnormality. Adrenals/Urinary Tract: Both adrenal glands appear normal. The kidneys and collecting system appear normal without evidence of urinary tract calculus or hydronephrosis. Bladder is unremarkable. Stomach/Bowel: The  stomach is unremarkable. There is a air-filled duodenal diverticulum seen at the posterior third portion of the duodenum. The remainder of the small bowel is grossly unremarkable. There appears to be mild wall thickening with hyperenhancement seen of the sigmoid colon and rectum with question of mild mesenteric surrounding edema. No definite surrounding free air or loculated fluid collections however are noted. Vascular/Lymphatic: There are no enlarged mesenteric, retroperitoneal, or pelvic lymph nodes. Scattered aortic atherosclerotic calcifications are seen without aneurysmal dilatation. Reproductive: The uterus and adnexa are unremarkable. Other: No evidence of abdominal wall mass or hernia. Musculoskeletal: Posterior spinal rod fixation from scoliotic surgery seen. There is anterior interbody fusion at L5-S1. There appears to be a significant periprosthetic lucency seen around the left sacroiliac screw measuring up to 8 mm. IMPRESSION: 1. No central, segmental, or subsegmental pulmonary embolism. 2. Mild new reticulonodular/tree-in-bud opacities at the posterior peripheral right lung base in comparison to the prior exam which could be due to an infectious or inflammatory process. 3.  Emphysema (ICD10-J43.9). 4. 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 No surrounding free air or loculated fluid collections. No evidence of obstruction 5. 8 mm periprosthetic lucency around the left sacroiliac screw which could be due to loosening. 6.  Aortic Atherosclerosis (ICD10-I70.0). Electronically Signed   By: Jonna Clark M.D.   On: 02/09/2020 23:11   DG Chest Portable 1 View  Result Date: 02/09/2020 CLINICAL DATA:  Shortness of breath EXAM: PORTABLE CHEST 1 VIEW COMPARISON:  01/24/2019 FINDINGS: Cardiac shadow is within normal limits. Orthopedic hardware is again noted and stable. Some fractures of fixation rods are seen but chronic. No  infiltrate or sizable effusion is seen. No acute bony abnormality is noted. Stable scoliosis is noted. IMPRESSION: None no acute abnormality noted. Chronic changes as described above. Electronically Signed   By: Alcide Clever M.D.   On: 02/09/2020 19:14    Procedures .Critical Care Performed by: Maxwell Caul, PA-C Authorized by: Maxwell Caul, PA-C   Critical care provider statement:    Critical care time (minutes):  35   Critical care was necessary to treat or prevent imminent or life-threatening deterioration of the following conditions:  Respiratory failure   Critical care was time spent personally by me on the following activities:  Discussions with consultants, evaluation of patient's response  to treatment, examination of patient, ordering and performing treatments and interventions, ordering and review of laboratory studies, ordering and review of radiographic studies, pulse oximetry, re-evaluation of patient's condition, obtaining history from patient or surrogate and review of old charts   (including critical care time)  Medications Ordered in ED Medications  albuterol (VENTOLIN HFA) 108 (90 Base) MCG/ACT inhaler 2 puff (has no administration in time range)  ceFEPIme (MAXIPIME) 1 g in sodium chloride 0.9 % 100 mL IVPB (has no administration in time range)  vancomycin (VANCOCIN) IVPB 1000 mg/200 mL premix (has no administration in time range)  metroNIDAZOLE (FLAGYL) IVPB 500 mg (has no administration in time range)  sodium chloride 0.9 % bolus 1,000 mL (0 mLs Intravenous Stopped 02/09/20 2104)  acetaminophen (TYLENOL) tablet 1,000 mg (1,000 mg Oral Given 02/09/20 2107)  naloxone [facility]) injection 0.4 mg (0.4 mg Intravenous Given 02/09/20 2106)  iohexol (OMNIPAQUE) 350 MG/ML injection 100 mL (100 mLs Intravenous Contrast Given 02/09/20 2235)    ED Course  I have reviewed the triage vital signs and the nursing notes.  Pertinent labs & imaging results that were available during  my care of the patient were reviewed by me and considered in my medical decision making (see chart for details).    MDM Rules/Calculators/A&P                      59 year old female past medical history of pulmonary emphysema brought in for evaluation of shortness of breath, generalized weakness, lethargy.  Recent admission to [facility] for evaluation of pneumonia.  Was discharged home and had been doing better.  She saw her psychologist yesterday morning.  She reports that when she went home, she started feeling weak, achy.  She then started feeling very tired and sleepy.  She does report that she has had some trouble breathing and coughing.  Denies any chest pain.  On initial ED arrival, she is afebrile but does appear very drowsy.  She is arousable to verbal stimuli.  She is slightly tachycardic.  O2 sats staying around the low 90s.  Lungs have rhonchi noted diffusely.  Concern for infectious process versus polypharmacy versus dehydration.  Plan to check labs.  RT informed me that patient with O2 sats of 88% on RA. Patient was placed on 2L O2 with improvement in O2 sats.   Lactic is elevated 2.1.  We will plan for fluids.  VBG shows pH of 7.3, PCO2 of 59.8.  CBC shows no leukocytosis or anemia.  BMP shows normal BUN and creatinine.  LFTs within normal limits.  UA shows trace leukocytes, UDS positive for benzos. CXR shows no acute infectious process.   Lactic improved to 1.4 after fluids.  Lance Bosch is mildly elevated at 21.  I suspect this is more likely to demand rather than true ACS etiology.  I discussed with son who is at bedside.  He is concerned about polypharmacy.  He does not think she has been taking more of her Xanax and she is supposed to but is concerned about the OxyContin and oxycodone.  Patient did given get Narcan and had some increased responsiveness.  Patient ambulated and O2 sats dropped to 82%.  She is 95% on 2 L without any signs of abnormalities.  Covid  negative.  Given her recent hospitalization, unexplained hypoxia, will plan for CTA for evaluation of PE.  CTA shows no central, segmental or subsegmental PE.  She has a new reticulonodular tree-in-bud opacities at the posterior  peripheral right lung.  This appears to be new from previous exam.  She has diffuse wall thickening subserosal enhancement around the sigmoid colon and rectum which is likely due to proctocolitis.  No evidence of obstruction.  Given concern for healthcare associated pneumonia, will plan to start patient on antibiotics.  Given hypoxia, will need to be admitted.  Discussed patient with Dr. Alvester MorinBell (hospitalist) who accepts patient for admission. Recommends giving a dose of flagyl for the proctocolitis.   Portions of this note were generated with Scientist, clinical (histocompatibility and immunogenetics)Dragon dictation software. Dictation errors may occur despite best attempts at proofreading.   Final Clinical Impression(s) / ED Diagnoses Final diagnoses:  HCAP (healthcare-associated pneumonia)  Hypoxia  Lethargy    Rx / DC Orders ED Discharge Orders    None       Rosana HoesLayden, Anahita Cua A, PA-C 02/10/20 Karrie [id number]    Rees, Elizabeth, MD 02/10/20 [phone]

## 2020-02-09 NOTE — ED Notes (Addendum)
Mardella Layman, [place]  And Hansel Starling, RN aware of lactic 2.1

## 2020-02-09 NOTE — ED Notes (Signed)
Pt is awake and alert at this time, speech is slurred. Son at bedside. Pt ambulated in hallway on room air, ambulated without difficulty, but SpO2 down to 82%. Denies pain.

## 2020-02-09 NOTE — Progress Notes (Signed)
Patient SPO2 was trending at 88% for 10 minutes.  Placed patient on 2 liter nasal cannula.  Patient's SPO2 increased to 100% over 3 minutes times and patient began taking deeper breaths.  Reduced nasal cannula to 1 liter.  Patient's SPO2 remained at 100 % and patient continues go take deeper breaths.  RT will continue to monitor.

## 2020-02-10 ENCOUNTER — Encounter (HOSPITAL_COMMUNITY): Payer: Self-pay | Admitting: Internal Medicine

## 2020-02-10 DIAGNOSIS — E872 Acidosis, unspecified: Secondary | ICD-10-CM

## 2020-02-10 DIAGNOSIS — E876 Hypokalemia: Secondary | ICD-10-CM | POA: Diagnosis not present

## 2020-02-10 DIAGNOSIS — J189 Pneumonia, unspecified organism: Secondary | ICD-10-CM | POA: Diagnosis present

## 2020-02-10 DIAGNOSIS — Y95 Nosocomial condition: Secondary | ICD-10-CM | POA: Diagnosis present

## 2020-02-10 DIAGNOSIS — A419 Sepsis, unspecified organism: Secondary | ICD-10-CM

## 2020-02-10 DIAGNOSIS — R0602 Shortness of breath: Secondary | ICD-10-CM | POA: Diagnosis present

## 2020-02-10 DIAGNOSIS — K529 Noninfective gastroenteritis and colitis, unspecified: Secondary | ICD-10-CM

## 2020-02-10 DIAGNOSIS — I248 Other forms of acute ischemic heart disease: Secondary | ICD-10-CM | POA: Diagnosis present

## 2020-02-10 DIAGNOSIS — K5130 Ulcerative (chronic) rectosigmoiditis without complications: Secondary | ICD-10-CM | POA: Diagnosis present

## 2020-02-10 DIAGNOSIS — J441 Chronic obstructive pulmonary disease with (acute) exacerbation: Secondary | ICD-10-CM

## 2020-02-10 DIAGNOSIS — N390 Urinary tract infection, site not specified: Secondary | ICD-10-CM | POA: Diagnosis present

## 2020-02-10 DIAGNOSIS — Z87891 Personal history of nicotine dependence: Secondary | ICD-10-CM | POA: Diagnosis not present

## 2020-02-10 DIAGNOSIS — F339 Major depressive disorder, recurrent, unspecified: Secondary | ICD-10-CM | POA: Diagnosis not present

## 2020-02-10 DIAGNOSIS — G8929 Other chronic pain: Secondary | ICD-10-CM | POA: Diagnosis present

## 2020-02-10 DIAGNOSIS — Z20822 Contact with and (suspected) exposure to covid-19: Secondary | ICD-10-CM | POA: Diagnosis present

## 2020-02-10 DIAGNOSIS — Z981 Arthrodesis status: Secondary | ICD-10-CM | POA: Diagnosis not present

## 2020-02-10 DIAGNOSIS — R0902 Hypoxemia: Secondary | ICD-10-CM | POA: Diagnosis present

## 2020-02-10 DIAGNOSIS — M549 Dorsalgia, unspecified: Secondary | ICD-10-CM | POA: Diagnosis present

## 2020-02-10 DIAGNOSIS — F32A Depression, unspecified: Secondary | ICD-10-CM

## 2020-02-10 DIAGNOSIS — A498 Other bacterial infections of unspecified site: Secondary | ICD-10-CM | POA: Diagnosis not present

## 2020-02-10 DIAGNOSIS — Z7951 Long term (current) use of inhaled steroids: Secondary | ICD-10-CM | POA: Diagnosis not present

## 2020-02-10 DIAGNOSIS — F419 Anxiety disorder, unspecified: Secondary | ICD-10-CM | POA: Diagnosis present

## 2020-02-10 DIAGNOSIS — J439 Emphysema, unspecified: Secondary | ICD-10-CM | POA: Diagnosis present

## 2020-02-10 LAB — BLOOD GAS, ARTERIAL
Acid-base deficit: 0.1 mmol/L (ref 0.0–2.0)
Bicarbonate: 25 mmol/L (ref 20.0–28.0)
FIO2: 24
O2 Saturation: 91.8 %
Patient temperature: 98.6
pCO2 arterial: 45.1 mmHg (ref 32.0–48.0)
pH, Arterial: 7.362 (ref 7.350–7.450)
pO2, Arterial: 69.3 mmHg — ABNORMAL LOW (ref 83.0–108.0)

## 2020-02-10 LAB — EXPECTORATED SPUTUM ASSESSMENT W GRAM STAIN, RFLX TO RESP C

## 2020-02-10 LAB — GRAM STAIN

## 2020-02-10 LAB — APTT: aPTT: 35 seconds (ref 24–36)

## 2020-02-10 LAB — PROTIME-INR
INR: 1.2 (ref 0.8–1.2)
Prothrombin Time: 14.6 seconds (ref 11.4–15.2)

## 2020-02-10 LAB — PROCALCITONIN: Procalcitonin: 2.07 ng/mL

## 2020-02-10 LAB — LACTIC ACID, PLASMA
Lactic Acid, Venous: 1.1 mmol/L (ref 0.5–1.9)
Lactic Acid, Venous: 0.8 mmol/L (ref 0.5–1.9)

## 2020-02-10 LAB — BRAIN NATRIURETIC PEPTIDE: B Natriuretic Peptide: 65.3 pg/mL (ref 0.0–100.0)

## 2020-02-10 LAB — TROPONIN I (HIGH SENSITIVITY)
Troponin I (High Sensitivity): 10 ng/L (ref <18)
Troponin I (High Sensitivity): 10 ng/L (ref <18)

## 2020-02-10 LAB — STREP PNEUMONIAE URINARY ANTIGEN: Strep Pneumo Urinary Antigen: NEGATIVE

## 2020-02-10 MED ORDER — POLYETHYLENE GLYCOL 3350 17 G PO PACK: 17.0000 g | PACK | Freq: Every day | ORAL | Status: DC | PRN

## 2020-02-10 MED ORDER — MOMETASONE FURO-FORMOTEROL FUM 200-5 MCG/ACT IN AERO
2.0000 | INHALATION_SPRAY | Freq: Two times a day (BID) | RESPIRATORY_TRACT | Status: DC
  Administered 2020-02-10 – 2020-02-12 (×5): 2 via RESPIRATORY_TRACT
  Filled 2020-02-10: qty 8.8

## 2020-02-10 MED ORDER — IPRATROPIUM-ALBUTEROL 0.5-2.5 (3) MG/3ML IN SOLN
3.0000 mL | Freq: Four times a day (QID) | RESPIRATORY_TRACT | Status: DC
  Administered 2020-02-10: 3 mL via RESPIRATORY_TRACT
  Filled 2020-02-10: qty 3

## 2020-02-10 MED ORDER — DULOXETINE HCL 60 MG PO CPEP
120.0000 mg | ORAL_CAPSULE | Freq: Every day | ORAL | Status: DC
  Administered 2020-02-10 – 2020-02-12 (×3): 120 mg via ORAL
  Filled 2020-02-10 (×3): qty 2

## 2020-02-10 MED ORDER — DM-GUAIFENESIN ER 30-600 MG PO TB12: 1.0000 | ORAL_TABLET | Freq: Two times a day (BID) | ORAL | Status: DC | PRN

## 2020-02-10 MED ORDER — IPRATROPIUM-ALBUTEROL 0.5-2.5 (3) MG/3ML IN SOLN
3.0000 mL | Freq: Two times a day (BID) | RESPIRATORY_TRACT | Status: DC
  Administered 2020-02-10 – 2020-02-11 (×2): 3 mL via RESPIRATORY_TRACT
  Filled 2020-02-10 (×2): qty 3

## 2020-02-10 MED ORDER — VANCOMYCIN HCL IN DEXTROSE 1-5 GM/200ML-% IV SOLN
1000.0000 mg | INTRAVENOUS | Status: DC
  Administered 2020-02-10: 1000 mg via INTRAVENOUS
  Filled 2020-02-10: qty 200

## 2020-02-10 MED ORDER — METHYLPREDNISOLONE SODIUM SUCC 125 MG IJ SOLR
60.0000 mg | Freq: Two times a day (BID) | INTRAMUSCULAR | Status: DC
  Administered 2020-02-10: 60 mg via INTRAVENOUS
  Filled 2020-02-10: qty 2

## 2020-02-10 MED ORDER — ENOXAPARIN SODIUM 40 MG/0.4ML ~~LOC~~ SOLN
40.0000 mg | SUBCUTANEOUS | Status: DC
  Administered 2020-02-10 – 2020-02-11 (×2): 40 mg via SUBCUTANEOUS
  Filled 2020-02-10 (×3): qty 0.4

## 2020-02-10 MED ORDER — ALPRAZOLAM 0.5 MG PO TABS: 0.5000 mg | ORAL_TABLET | Freq: Four times a day (QID) | ORAL | Status: DC

## 2020-02-10 MED ORDER — ONDANSETRON HCL 4 MG/2ML IJ SOLN: 4.0000 mg | Freq: Four times a day (QID) | INTRAMUSCULAR | Status: DC | PRN

## 2020-02-10 MED ORDER — ACETAMINOPHEN 325 MG PO TABS
650.0000 mg | ORAL_TABLET | Freq: Four times a day (QID) | ORAL | Status: DC | PRN
  Administered 2020-02-10: 650 mg via ORAL
  Filled 2020-02-10: qty 2

## 2020-02-10 MED ORDER — METRONIDAZOLE IN NACL 5-0.79 MG/ML-% IV SOLN
500.0000 mg | Freq: Once | INTRAVENOUS | Status: AC
  Administered 2020-02-10: 500 mg via INTRAVENOUS
  Filled 2020-02-10: qty 100

## 2020-02-10 MED ORDER — OXYCODONE HCL ER 20 MG PO T12A
20.0000 mg | EXTENDED_RELEASE_TABLET | Freq: Two times a day (BID) | ORAL | Status: DC
  Administered 2020-02-10 – 2020-02-12 (×5): 20 mg via ORAL
  Filled 2020-02-10 (×5): qty 1

## 2020-02-10 MED ORDER — SODIUM CHLORIDE 0.9 % IV SOLN: INTRAVENOUS | Status: DC

## 2020-02-10 MED ORDER — SODIUM CHLORIDE 0.9 % IV SOLN
2.0000 g | Freq: Once | INTRAVENOUS | Status: AC
  Administered 2020-02-10: 2 g via INTRAVENOUS
  Filled 2020-02-10: qty 2

## 2020-02-10 MED ORDER — OXYCODONE HCL 5 MG PO TABS
10.0000 mg | ORAL_TABLET | Freq: Two times a day (BID) | ORAL | Status: DC | PRN
  Administered 2020-02-10 – 2020-02-12 (×3): 10 mg via ORAL
  Filled 2020-02-10 (×3): qty 2

## 2020-02-10 MED ORDER — SENNOSIDES-DOCUSATE SODIUM 8.6-50 MG PO TABS: 2.0000 | ORAL_TABLET | Freq: Every evening | ORAL | Status: DC | PRN

## 2020-02-10 MED ORDER — ACETAMINOPHEN 325 MG PO TABS
650.0000 mg | ORAL_TABLET | Freq: Four times a day (QID) | ORAL | Status: DC | PRN
  Administered 2020-02-11: 650 mg via ORAL
  Filled 2020-02-10: qty 2

## 2020-02-10 MED ORDER — METRONIDAZOLE IN NACL 5-0.79 MG/ML-% IV SOLN
500.0000 mg | Freq: Three times a day (TID) | INTRAVENOUS | Status: DC
  Administered 2020-02-10 – 2020-02-12 (×7): 500 mg via INTRAVENOUS
  Filled 2020-02-10 (×7): qty 100

## 2020-02-10 MED ORDER — PREGABALIN 100 MG PO CAPS: 200.0000 mg | ORAL_CAPSULE | Freq: Three times a day (TID) | ORAL | Status: DC

## 2020-02-10 MED ORDER — PREGABALIN 100 MG PO CAPS
200.0000 mg | ORAL_CAPSULE | Freq: Three times a day (TID) | ORAL | Status: DC
  Administered 2020-02-10 – 2020-02-12 (×6): 200 mg via ORAL
  Filled 2020-02-10 (×6): qty 2

## 2020-02-10 MED ORDER — SODIUM CHLORIDE 0.9 % IV SOLN: 2.0000 g | Freq: Two times a day (BID) | INTRAVENOUS | Status: DC

## 2020-02-10 MED ORDER — PREDNISONE 20 MG PO TABS
40.0000 mg | ORAL_TABLET | Freq: Every day | ORAL | Status: DC
  Administered 2020-02-11 – 2020-02-12 (×2): 40 mg via ORAL
  Filled 2020-02-10 (×2): qty 2

## 2020-02-10 MED ORDER — NICOTINE 21 MG/24HR TD PT24
21.0000 mg | MEDICATED_PATCH | Freq: Every day | TRANSDERMAL | Status: DC
  Administered 2020-02-10 – 2020-02-12 (×3): 21 mg via TRANSDERMAL
  Filled 2020-02-10 (×3): qty 1

## 2020-02-10 MED ORDER — OXYCODONE HCL 5 MG PO TABS: 10.0000 mg | ORAL_TABLET | Freq: Two times a day (BID) | ORAL | Status: DC | PRN

## 2020-02-10 MED ORDER — PREDNISONE 20 MG PO TABS: 40.0000 mg | ORAL_TABLET | Freq: Every day | ORAL | Status: DC

## 2020-02-10 MED ORDER — ALPRAZOLAM 0.25 MG PO TABS
0.2500 mg | ORAL_TABLET | Freq: Four times a day (QID) | ORAL | Status: DC
  Administered 2020-02-10 – 2020-02-12 (×9): 0.25 mg via ORAL
  Filled 2020-02-10 (×9): qty 1

## 2020-02-10 MED ORDER — ALBUTEROL SULFATE (2.5 MG/3ML) 0.083% IN NEBU
2.5000 mg | INHALATION_SOLUTION | RESPIRATORY_TRACT | Status: DC | PRN
  Administered 2020-02-11: 2.5 mg via RESPIRATORY_TRACT
  Filled 2020-02-10 (×2): qty 3

## 2020-02-10 MED ORDER — ARIPIPRAZOLE 5 MG PO TABS: 5.0000 mg | ORAL_TABLET | Freq: Every day | ORAL | Status: DC

## 2020-02-10 MED ORDER — SODIUM CHLORIDE 0.9 % IV SOLN
2.0000 g | Freq: Two times a day (BID) | INTRAVENOUS | Status: DC
  Administered 2020-02-10 – 2020-02-11 (×2): 2 g via INTRAVENOUS
  Filled 2020-02-10 (×2): qty 2

## 2020-02-10 MED ORDER — OXYCODONE HCL 10 MG PO TABS: 10.0000 mg | ORAL_TABLET | Freq: Two times a day (BID) | ORAL | Status: DC | PRN

## 2020-02-10 NOTE — Progress Notes (Signed)
5/16 Code Sepsis. Called after patient was receiving ABX, so blood cultures were not obtained. Patient had two LA prior, and the last one was below 2.0. Had 1,000 ml fluid bolus earlier in the shift.

## 2020-02-10 NOTE — Progress Notes (Signed)
PROGRESS NOTE    LONA SIX  UMP:[id number] DOB: Aug 06, 1961 DOA: 02/09/2020 PCP: Patient, No Pcp Per   Brief Narrative:  59 year old with history of emphysema, anxiety, depression, scoliosis, chronic back pain issues status post thoracolumbar surgery recently was also admitted at [facility] for sepsis secondary to CAP with COPD exacerbation from 4/29-5/3.  Was discharged on 7 days of antibiotic and prednisone taper.  After going home continued to feel worse with progressive shortness of breath.  In the ER CTA chest abdomen pelvis was negative for PE but showed new reticular nodular tree-in-bud opacity, emphysema, proctocolitis.   Assessment & Plan:   Principal Problem:   Sepsis (HCC) Active Problems:   Pneumonia   Depression   Proctocolitis   Lactic acidosis   Acute lower UTI   COPD with acute exacerbation (HCC)   Sepsis secondary to HCAP, proctocolitis and mild urinary tract infection Acute COPD exacerbation Lactic acidosis, improved Follow-up culture data.  Supplemental oxygen Incentive spirometer, flutter valve Prednisone, scheduled and as needed bronchodilators, Dulera Antibiotics-vancomycin, cefepime, Flagyl.  De-escalate as appropriate over next 24 hours. Speech and swallow evaluation to rule out aspiration due to recurrence of pneumonia.  In the meantime dysphagia 1 diet GI panel-pending Oral prednisone 40 mg daily x 5 days  Elevated troponin, barely elevated.  Demand ischemia.  Remains flat without chest pain.  Nonischemic EKG  History of chronic pain Scoliosis with thoracolumbar surgery Pain control.  Bowel regimen.  Anxiety/Depression Resume home meds.  As needed benzos if necessary.  DVT prophylaxis: Lovenox Code Status: Full code Family Communication: None  Status is: Inpatient  Remains inpatient appropriate because:Inpatient level of care appropriate due to severity of illness   Dispo: The patient is from: Home               Anticipated d/c is to: Home              Anticipated d/c date is: 2 days              Patient currently is not medically stable to d/c.  Failed outpatient treatment with oral antibiotics.  Ongoing work-up for sepsis requiring IV antibiotics.  Not safe for discharge today until blood pressure and heart rate are stable, tolerating orals without any issues and breathing is improved.  Subjective: Tells me she has 2 loose bowel movements every day which is her routine but has not had diarrhea in the hospital yet.  Does have some exertional dyspnea and nonproductive coughing.  She denies any dysuria  Review of Systems Otherwise negative except as per HPI, including: General: Denies fever, chills, night sweats or unintended weight loss. Resp: Denies cough, wheezing, shortness of breath. Cardiac: Denies chest pain, palpitations, orthopnea, paroxysmal nocturnal dyspnea. GI: Denies abdominal pain, nausea, vomiting, diarrhea or constipation GU: Denies dysuria, frequency, hesitancy or incontinence MS: Denies muscle aches, joint pain or swelling Neuro: Denies headache, neurologic deficits (focal weakness, numbness, tingling), abnormal gait Psych: Denies anxiety, depression, SI/HI/AVH Skin: Denies new rashes or lesions ID: Denies sick contacts, exotic exposures, travel  Examination:  Constitutional: Not in acute distress, chronically frail.  Cachectic. Respiratory: Bibasilar rhonchi Cardiovascular: Normal sinus rhythm, no rubs Abdomen: Nontender nondistended good bowel sounds Musculoskeletal: No edema noted Skin: No rashes seen Neurologic: CN 2-12 grossly intact.  And nonfocal Psychiatric: Normal judgment and insight. Alert and oriented x 3. Normal mood.  Objective: Vitals:   02/10/20 0244 02/10/20 0247 02/10/20 0443 02/10/20 0649  BP: (!) 79/45 (!) 116/59 Marland Kitchen)  98/56 (!) 97/55  Pulse: 90 92 93 88  Resp:   18 18  Temp: 98.2 F (36.8 C)  (!) 97.4 F (36.3 C) (!) 97.3 F (36.3 C)  TempSrc:    Oral Oral  SpO2: 98% 97% 97% 93%  Weight:      Height:        Intake/Output Summary (Last 24 hours) at 02/10/2020 0741 Last data filed at 02/10/2020 0600 Gross per 24 hour  Intake 2691.23 ml  Output 1 ml  Net 2690.23 ml   Filed Weights   02/09/20 1803  Weight: 48.1 kg     Data Reviewed:   CBC: Recent Labs  Lab 02/09/20 1819 02/09/20 1952  WBC 9.0  --   HGB 15.5* 16.7*  HCT 46.1* 49.0*  MCV 86.5  --   PLT 390  --    Basic Metabolic Panel: Recent Labs  Lab 02/09/20 1819 02/09/20 1952  NA 137 137  K 3.5 4.5  CL 98  --   CO2 27  --   GLUCOSE 118*  --   BUN 19  --   CREATININE 0.98  --   CALCIUM 9.0  --    GFR: Estimated Creatinine Clearance: 48.1 mL/min (by C-G formula based on SCr of 0.98 mg/dL). Liver Function Tests: Recent Labs  Lab 02/09/20 1819  AST 25  ALT 13  ALKPHOS 81  BILITOT 0.9  PROT 8.2*  ALBUMIN 3.5   No results for input(s): LIPASE, AMYLASE in the last 168 hours. Recent Labs  Lab 02/09/20 2253  AMMONIA 22   Coagulation Profile: Recent Labs  Lab 02/10/20 0522  INR 1.2   Cardiac Enzymes: No results for input(s): CKTOTAL, CKMB, CKMBINDEX, TROPONINI in the last 168 hours. BNP (last 3 results) No results for input(s): PROBNP in the last 8760 hours. HbA1C: No results for input(s): HGBA1C in the last 72 hours. CBG: Recent Labs  Lab 02/09/20 2317  GLUCAP 101*   Lipid Profile: No results for input(s): CHOL, HDL, LDLCALC, TRIG, CHOLHDL, LDLDIRECT in the last 72 hours. Thyroid Function Tests: No results for input(s): TSH, T4TOTAL, FREET4, T3FREE, THYROIDAB in the last 72 hours. Anemia Panel: No results for input(s): VITAMINB12, FOLATE, FERRITIN, TIBC, IRON, RETICCTPCT in the last 72 hours. Sepsis Labs: Recent Labs  Lab 02/09/20 1923 02/09/20 2110 02/10/20 0522  LATICACIDVEN 2.1* 1.4 1.1    Recent Results (from the past 240 hour(s))  SARS Coronavirus 2 by RT PCR (hospital order, performed in [facility] hospital lab)  Nasopharyngeal Nasopharyngeal Swab     Status: None   Collection Time: 02/09/20  7:23 PM   Specimen: Nasopharyngeal Swab  Result Value Ref Range Status   SARS Coronavirus 2 NEGATIVE NEGATIVE Final    Comment: (NOTE) SARS-CoV-2 target nucleic acids are NOT DETECTED. The SARS-CoV-2 RNA is generally detectable in upper and lower respiratory specimens during the acute phase of infection. The lowest concentration of SARS-CoV-2 viral copies this assay can detect is 250 copies / mL. A negative result does not preclude SARS-CoV-2 infection and should not be used as the sole basis for treatment or other patient management decisions.  A negative result may occur with improper specimen collection / handling, submission of specimen other than nasopharyngeal swab, presence of viral mutation(s) within the areas targeted by this assay, and inadequate number of viral copies (<250 copies / mL). A negative result must be combined with clinical observations, patient history, and epidemiological information. Fact Sheet for Patients:   BoilerBrush.com.cy Fact Sheet for Healthcare  Providers: https://pope.com/https://www.fda.gov/media/136313/download This test is not yet approved or cleared  by the [place] FDA and has been authorized for detection and/or diagnosis of SARS-CoV-2 by FDA under an Emergency Use Authorization (EUA).  This EUA will remain in effect (meaning this test can be used) for the duration of the COVID-19 declaration under Section 564(b)(1) of the Act, 21 U.S.C. section 360bbb-3(b)(1), unless the authorization is terminated or revoked sooner. Performed at [facility] [place], [address] [place], [place] [address]          Radiology Studies: CT Angio Chest PE W and/or Wo Contrast  Result Date: 02/09/2020 CLINICAL DATA:  Shortness of breath and abdominal pain EXAM: CT ANGIOGRAPHY CHEST WITH CONTRAST TECHNIQUE: Multidetector CT imaging of the chest was performed  using the standard protocol during bolus administration of intravenous contrast. Multiplanar CT image reconstructions and MIPs were obtained to evaluate the vascular anatomy. CONTRAST:  100mL OMNIPAQUE IOHEXOL 350 MG/ML SOLN COMPARISON:  January 09, 2020 FINDINGS: Cardiovascular: There is a optimal opacification of the pulmonary arteries. There is no central,segmental, or subsegmental filling defects within the pulmonary arteries. The heart is normal in size. No pericardial effusion or thickening. No evidence right heart strain. There is normal three-vessel brachiocephalic anatomy without proximal stenosis. The thoracic aorta is normal in appearance. Mediastinum/Nodes: No hilar, mediastinal, or axillary adenopathy. Thyroid gland, trachea, and esophagus demonstrate no significant findings. Lungs/Pleura: Centrilobular emphysematous changes are seen throughout both lungs. There interval slight worsening of the reticulonodular small tree-in-bud opacities at the posterior right peripheral lung base. There is slight interval improvement however in the right middle lobe tree-in-bud opacities. The left lung is otherwise clear. No pleural effusion. Upper Abdomen: No acute abnormalities present in the visualized portions of the upper abdomen. Musculoskeletal: No chest wall abnormality. No acute or significant osseous findings. Again noted is a scoliotic curvature with posterior spinal rod fixation. Review of the MIP images confirms the above findings. Abdomen/pelvis: Hepatobiliary: The liver is normal in density without focal abnormality.The main portal vein is patent. No evidence of calcified gallstones, gallbladder wall thickening or biliary dilatation. Pancreas: Unremarkable. No pancreatic ductal dilatation or surrounding inflammatory changes. Spleen: Normal in size without focal abnormality. Adrenals/Urinary Tract: Both adrenal glands appear normal. The kidneys and collecting system appear normal without evidence of urinary  tract calculus or hydronephrosis. Bladder is unremarkable. Stomach/Bowel: The stomach is unremarkable. There is a air-filled duodenal diverticulum seen at the posterior third portion of the duodenum. The remainder of the small bowel is grossly unremarkable. There appears to be mild wall thickening with hyperenhancement seen of the sigmoid colon and rectum with question of mild mesenteric surrounding edema. No definite surrounding free air or loculated fluid collections however are noted. Vascular/Lymphatic: There are no enlarged mesenteric, retroperitoneal, or pelvic lymph nodes. Scattered aortic atherosclerotic calcifications are seen without aneurysmal dilatation. Reproductive: The uterus and adnexa are unremarkable. Other: No evidence of abdominal wall mass or hernia. Musculoskeletal: Posterior spinal rod fixation from scoliotic surgery seen. There is anterior interbody fusion at L5-S1. There appears to be a significant periprosthetic lucency seen around the left sacroiliac screw measuring up to 8 mm. IMPRESSION: 1. No central, segmental, or subsegmental pulmonary embolism. 2. Mild new reticulonodular/tree-in-bud opacities at the posterior peripheral right lung base in comparison to the prior exam which could be due to an infectious or inflammatory process. 3.  Emphysema (ICD10-J43.9). 4. 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 No  surrounding free air or loculated fluid collections. No evidence of obstruction 5. 8 mm periprosthetic lucency around the left sacroiliac screw which could be due to loosening. 6.  Aortic Atherosclerosis (ICD10-I70.0). Electronically Signed   By: Prudencio Pair M.D.   On: 02/09/2020 23:11   CT ABDOMEN PELVIS W CONTRAST  Result Date: 02/09/2020 CLINICAL DATA:  Shortness of breath and abdominal pain EXAM: CT ANGIOGRAPHY CHEST WITH CONTRAST TECHNIQUE: Multidetector CT imaging of the chest was performed  using the standard protocol during bolus administration of intravenous contrast. Multiplanar CT image reconstructions and MIPs were obtained to evaluate the vascular anatomy. CONTRAST:  152mL OMNIPAQUE IOHEXOL 350 MG/ML SOLN COMPARISON:  January 09, 2020 FINDINGS: Cardiovascular: There is a optimal opacification of the pulmonary arteries. There is no central,segmental, or subsegmental filling defects within the pulmonary arteries. The heart is normal in size. No pericardial effusion or thickening. No evidence right heart strain. There is normal three-vessel brachiocephalic anatomy without proximal stenosis. The thoracic aorta is normal in appearance. Mediastinum/Nodes: No hilar, mediastinal, or axillary adenopathy. Thyroid gland, trachea, and esophagus demonstrate no significant findings. Lungs/Pleura: Centrilobular emphysematous changes are seen throughout both lungs. There interval slight worsening of the reticulonodular small tree-in-bud opacities at the posterior right peripheral lung base. There is slight interval improvement however in the right middle lobe tree-in-bud opacities. The left lung is otherwise clear. No pleural effusion. Upper Abdomen: No acute abnormalities present in the visualized portions of the upper abdomen. Musculoskeletal: No chest wall abnormality. No acute or significant osseous findings. Again noted is a scoliotic curvature with posterior spinal rod fixation. Review of the MIP images confirms the above findings. Abdomen/pelvis: Hepatobiliary: The liver is normal in density without focal abnormality.The main portal vein is patent. No evidence of calcified gallstones, gallbladder wall thickening or biliary dilatation. Pancreas: Unremarkable. No pancreatic ductal dilatation or surrounding inflammatory changes. Spleen: Normal in size without focal abnormality. Adrenals/Urinary Tract: Both adrenal glands appear normal. The kidneys and collecting system appear normal without evidence of urinary  tract calculus or hydronephrosis. Bladder is unremarkable. Stomach/Bowel: The stomach is unremarkable. There is a air-filled duodenal diverticulum seen at the posterior third portion of the duodenum. The remainder of the small bowel is grossly unremarkable. There appears to be mild wall thickening with hyperenhancement seen of the sigmoid colon and rectum with question of mild mesenteric surrounding edema. No definite surrounding free air or loculated fluid collections however are noted. Vascular/Lymphatic: There are no enlarged mesenteric, retroperitoneal, or pelvic lymph nodes. Scattered aortic atherosclerotic calcifications are seen without aneurysmal dilatation. Reproductive: The uterus and adnexa are unremarkable. Other: No evidence of abdominal wall mass or hernia. Musculoskeletal: Posterior spinal rod fixation from scoliotic surgery seen. There is anterior interbody fusion at L5-S1. There appears to be a significant periprosthetic lucency seen around the left sacroiliac screw measuring up to 8 mm. IMPRESSION: 1. No central, segmental, or subsegmental pulmonary embolism. 2. Mild new reticulonodular/tree-in-bud opacities at the posterior peripheral right lung base in comparison to the prior exam which could be due to an infectious or inflammatory process. 3.  Emphysema (ICD10-J43.9). 4. 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 No surrounding free air or loculated fluid collections. No evidence of obstruction 5. 8 mm periprosthetic lucency around the left sacroiliac screw which could be due to loosening. 6.  Aortic Atherosclerosis (ICD10-I70.0). Electronically Signed   By: Prudencio Pair M.D.   On: 02/09/2020 23:11   DG Chest Portable 1  View  Result Date: 02/09/2020 CLINICAL DATA:  Shortness of breath EXAM: PORTABLE CHEST 1 VIEW COMPARISON:  01/24/2019 FINDINGS: Cardiac shadow is within normal limits. Orthopedic hardware is  again noted and stable. Some fractures of fixation rods are seen but chronic. No infiltrate or sizable effusion is seen. No acute bony abnormality is noted. Stable scoliosis is noted. IMPRESSION: None no acute abnormality noted. Chronic changes as described above. Electronically Signed   By: Alcide Clever M.D.   On: 02/09/2020 19:14        Scheduled Meds: . ALPRAZolam  0.25 mg Oral QID  . DULoxetine  120 mg Oral Daily  . ipratropium-albuterol  3 mL Nebulization Q6H  . mometasone-formoterol  2 puff Inhalation BID  . nicotine  21 mg Transdermal Daily  . [START ON 02/11/2020] predniSONE  40 mg Oral Q breakfast   Continuous Infusions: . sodium chloride 75 mL/hr at 02/10/20 0507  . ceFEPime (MAXIPIME) IV    . metronidazole    . [START ON 02/11/2020] vancomycin       LOS: 0 days   Time spent= 40 mins    Desarae Placide Joline Maxcy, MD [facility]  If 7PM-7AM, please contact night-coverage  02/10/2020, 7:41 AM

## 2020-02-10 NOTE — Plan of Care (Signed)
  Problem: Clinical Measurements: Goal: Ability to maintain clinical measurements within normal limits will improve Outcome: Progressing   Problem: Clinical Measurements: Goal: Will remain free from infection Outcome: Progressing   Problem: Clinical Measurements: Goal: Respiratory complications will improve Outcome: Progressing   Problem: Coping: Goal: Level of anxiety will decrease Outcome: Progressing   Problem: Nutrition: Goal: Adequate nutrition will be maintained Outcome: Progressing   Problem: Safety: Goal: Ability to remain free from injury will improve Outcome: Progressing

## 2020-02-10 NOTE — Progress Notes (Signed)
Pharmacy Antibiotic Note  Jennifer Mcdaniel is a 59 y.o. female with hx emphysema presented to [facility] on 02/09/2020 with c/o generalized weakness and SOB. Chest CTA showed findings with concerns for PNA and proctocolitis. Pharmacy has been consulted to dose vancomycin for infection.  - scr 0.98 (crcl~48)  Plan: - vancomycin 1000 mg IV q24h for goal trough 15-20 - cefepime 2gm IV q12h per MD - flagyl 500 mg IV q8h per MD  _____________________________________  Height: 5\' 3"  (160 cm) Weight: 48.1 kg (106 lb) IBW/kg (Calculated) : 52.4  Temp (24hrs), Avg:98.5 F (36.9 C), Min:97.5 F (36.4 C), Max:100.2 F (37.9 C)  Recent Labs  Lab 02/09/20 1819 02/09/20 1923 02/09/20 2110  WBC 9.0  --   --   CREATININE 0.98  --   --   LATICACIDVEN  --  2.1* 1.4    Estimated Creatinine Clearance: 48.1 mL/min (by C-G formula based on SCr of 0.98 mg/dL).    No Known Allergies  Thank you for allowing pharmacy to be a part of this patient's care.  2111 02/10/2020 2:54 AM

## 2020-02-10 NOTE — Progress Notes (Signed)
Patient was admitted from  [facility] around 0200. Came in sleepy but easily arousable. Sh e was a yellow mews on admission, vitals signs rechecked and mews changed into green.

## 2020-02-10 NOTE — Evaluation (Signed)
Clinical/Bedside Swallow Evaluation Patient Details  Name: Jennifer Mcdaniel MRN: [id number] Date of Birth: 1961-05-03  Today's Date: 02/10/2020 Time: SLP Start Time (ACUTE ONLY): 1337 SLP Stop Time (ACUTE ONLY): 1402 SLP Time Calculation (min) (ACUTE ONLY): 25 min  Past Medical History:  Past Medical History:  Diagnosis Date  . Anxiety   . Depression   . Pulmonary emphysema (HCC)    Past Surgical History:  Past Surgical History:  Procedure Laterality Date  . BACK SURGERY     HPI:  Jennifer Mcdaniel is a 59 y.o. female with medical history significant of emphysema, anxiety depression, chronic back pain issues, scoliosis s/p thoracolumbar fusion surgery multiple times with recent interim history of admission to [facility]  4/29-5/3 for sepsis secondary to CAP with associated COPD exacerbation. Patient was discharged on oral antibotics x7 days, and prednisone taper and was restarted on her prior home medications. Patient now presents to ED with one day of increased cough and shob with increase somnolence and fatigue. Due to these symptoms patient was brought into ed for evaluation. Patient noted no fever, chest pain, focal weakness, but noted one episode of loose stool and mild lower abdominal pain.  Chest xray was showing no acute abnormality.  CT angio of the chest was showing the following:  no central, segmental, or subsegmental pulmonary embolism.  2. Mild new reticulonodular/tree-in-bud opacities at the posterior peripheral right lung base in comparison to the prior exam which could be due to an infectious or inflammatory process.   Assessment / Plan / Recommendation Clinical Impression  Clinical swallowing evaluation was completed using thin liquids via spoon, cup and straw, pureed material and dry solids.  The patient reported intermittent issues swallowing characterized by an oral holding of material every once in a while.  She is unsure why she does it, she just feels  like she can not swallow.  She did not endorse any "strangling" or the feeling that food/liquids don't want to go down.  RN reported no obvious issues with liquids this date.  Cranial nerve exam was completed and unremarkable.  Lingual, labial, facial and jaw range of motion and strength appeared to be adequate.  Her oral and pharyngeal swallow appeared essentially functional.  Mastication of dry solids appeared adequate.  Swallow trigger was appreciated to palpation and overt s/s of aspiration were not seen.  The patient repoted that the issues she has intermittently did not happen during this exam.  She was unable to continuously drink 3 oz of water.  Recommend advance her diet to regular with thin liquids.  Plan for MBS tomorrow to rule out silent aspiration given multiple recent admissions due to PNA, results of imaging and patient complaints.     SLP Visit Diagnosis: Dysphagia, unspecified (R13.10)    Aspiration Risk  Mild aspiration risk    Diet Recommendation   Regular with thin liquids  Medication Administration: Whole meds with liquid    Other  Recommendations Oral Care Recommendations: Oral care BID   Follow up Recommendations Other (comment)(TBD)        Swallow Study   General Date of Onset: 02/10/20 HPI: ELYA Mcdaniel is a 59 y.o. female with medical history significant of emphysema, anxiety depression, chronic back pain issues, scoliosis s/p thoracolumbar fusion surgery multiple times with recent interim history of admission to [facility]  4/29-5/3 for sepsis secondary to CAP with associated COPD exacerbation. Patient was discharged on oral antibotics x7 days, and prednisone taper and  was restarted on her prior home medications. Patient now presents to ED with one day of increased cough and shob with increase somnolence and fatigue. Due to these symptoms patient was brought into ed for evaluation. Patient noted no fever, chest pain, focal weakness, but noted one  episode of loose stool and mild lower abdominal pain.  Chest xray was showing no acute abnormality.  CT angio of the chest was showing the following:  no central, segmental, or subsegmental pulmonary embolism.  2. Mild new reticulonodular/tree-in-bud opacities at the posterior peripheral right lung base in comparison to the prior exam which could be due to an infectious or inflammatory process. Type of Study: Bedside Swallow Evaluation Previous Swallow Assessment: None noted at [facility]. Diet Prior to this Study: Dysphagia 1 (puree);Thin liquids Temperature Spikes Noted: Yes Respiratory Status: Room air History of Recent Intubation: No Behavior/Cognition: Alert;Cooperative;Pleasant mood Oral Cavity Assessment: Within Functional Limits Oral Care Completed by SLP: No Oral Cavity - Dentition: Missing dentition Vision: Functional for self-feeding Self-Feeding Abilities: Able to feed self Patient Positioning: Upright in bed Baseline Vocal Quality: Normal Volitional Cough: (Not tested) Volitional Swallow: Able to elicit    Oral/Motor/Sensory Function Overall Oral Motor/Sensory Function: Within functional limits   Ice Chips Ice chips: Not tested   Thin Liquid Thin Liquid: Within functional limits Presentation: Cup;Self Fed;Spoon;Straw    Nectar Thick Nectar Thick Liquid: Not tested   Honey Thick Honey Thick Liquid: Not tested   Puree Puree: Within functional limits Presentation: Spoon;Self Fed   Solid     Solid: Within functional limits Presentation: [place], [place], [place] [facility] SLP [phone]  Lamar Sprinkles 02/10/2020,2:10 PM

## 2020-02-10 NOTE — ED Notes (Signed)
Attempted report x1. [facility] at bedside

## 2020-02-10 NOTE — ED Notes (Signed)
[facility] notified (Jennifer Mcdaniel) - patient ready for transport 

## 2020-02-10 NOTE — Plan of Care (Signed)
Patient is a 59 yo F with PMH significant for emphysema and recent admission to [facility] due to pneumonia along with some concern for polypharmacy as well who presented to the ED with SOB, fever, chills. She had also complained of abdominal pain as well. She reportedly was lethargic although was easily arousable. CTA chest negative for PE but revealed new area in posterior right lung base concerning for pneumonia. CT ab/pelvis revealed proctocolitis. VBG showed mild respiratory acidosis with pH of 7.3 with a pCO2 of 59.  She was started on abx with vanc and cefepime with flagyl to be added as well for proctocolitis. She will be admitted to [facility] under the care of hospitalist service for HCAP as well as proctocolitis.

## 2020-02-10 NOTE — Progress Notes (Signed)
   02/10/20 0247  Vitals  BP (!) 116/59  MAP (mmHg) 76  BP Location Left Arm  BP Method Automatic  Pulse Rate 92  Pulse Rate Source Monitor  Oxygen Therapy  SpO2 97 %  MEWS Score  MEWS Temp 0  MEWS Systolic 0  MEWS Pulse 0  MEWS RR 0  MEWS LOC 0  MEWS Score 0  MEWS Score Color [place] Si

## 2020-02-10 NOTE — H&P (Signed)
History and Physical    Jennifer Mcdaniel GDJ:[id number] DOB: 1961/02/14 DOA: 02/09/2020  PCP: Patient, No Pcp Per  Patient coming from:  Home  I have personally briefly reviewed patient's old medical records in [facility] Link  Chief Complaint:  Shortness of breath x 1 day HPI: Jennifer Mcdaniel is a 59 y.o. female with medical history significant of  Emphysema, anxiety depression,chronic back pain issues, scoliosis s/p thoracolumbar fusion surgery multiple times with recent interim history of admission to [facility]  4/29-5/3 for sepsis secondary to CAP with associated COPD exacerbation. Patient of note was discharged on oral antibotics  x7 days, and prednisone taper and was restarted on her prior home medications. Patient now presents to ED with one day of increase cough and sob with increase somnolence and fatigue. Due to these symptoms patient was brought into ed for evaluation. Patient noted no fever, chest pain, focal weakness, but noted one episode of loose stool and mild lower abdominal pain.   ED Course:  Afeb, hr 121, bp 93/73, rr 22 Wbc:9, hbg 15.5 Cr: 0.98 CE:29, Lactic 2.1 Utox: +benzo CtA /CTabdomen: IMPRESSION: 1. No central, segmental, or subsegmental pulmonary embolism. 2. Mild new reticulonodular/tree-in-bud opacities at the posterior peripheral right lung base in comparison to the prior exam which could be due to an infectious or inflammatory process. 3.  Emphysema (ICD10-J43.9). 4. 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 No surrounding free air or loculated fluid collections. No evidence of obstruction 5. 8 mm periprosthetic lucency around the left sacroiliac screw which could be due to loosening. 6.  Aortic Atherosclerosis (ICD10-I70.0).   Review of Systems: As per HPI otherwise 10 point review of systems negative.   Past Medical History:  Diagnosis Date    Anxiety    Depression    Pulmonary emphysema (HCC)     Past Surgical History:  Procedure Laterality Date   BACK SURGERY       reports that she has quit smoking. Her smoking use included cigarettes. She has quit using smokeless tobacco. No history on file for alcohol and drug.  No Known Allergies  History reviewed. No pertinent family history. NA Prior to Admission medications   Medication Sig Start Date End Date Taking? Authorizing Provider  albuterol (VENTOLIN HFA) 108 (90 Base) MCG/ACT inhaler Inhale 2 puffs into the lungs every 4 (four) hours as needed for wheezing or shortness of breath.  01/12/20  Yes [provider]  ALPRAZolam Prudy Feeler) 0.5 MG tablet Take 0.5 mg by mouth 4 (four) times daily. 02/08/20  Yes [provider]  ARIPiprazole (ABILIFY) 5 MG tablet Take 5 mg by mouth daily. 11/16/19  Yes [provider]  budesonide-formoterol (SYMBICORT) 160-4.5 MCG/ACT inhaler Inhale 2 puffs into the lungs in the morning and at bedtime.  01/12/20 02/11/20 Yes [provider]  DULoxetine (CYMBALTA) 60 MG capsule Take 120 mg by mouth daily. 01/17/20  Yes [provider]  Multiple Vitamin (MULTIVITAMIN WITH MINERALS) TABS tablet Take 1 tablet by mouth daily.   Yes [provider]  naproxen sodium (ALEVE) 220 MG tablet Take 440 mg by mouth 2 (two) times daily as needed (pain).   Yes [provider]  nicotine (NICODERM CQ - DOSED IN MG/24 HOURS) 21 mg/24hr patch Place 21 mg onto the skin daily.  01/12/20  Yes [provider]  oxyCODONE (OXYCONTIN) 20 mg 12 hr tablet Take 20 mg by mouth every 12 (  twelve) hours.  10/30/19  Yes [provider]  Oxycodone HCl 10 MG TABS Take 10 mg by mouth 2 (two) times daily as needed (pain).  08/09/15  Yes [provider]  pregabalin (LYRICA) 200 MG capsule Take 200 mg by mouth 3 (three) times daily. 01/30/20  Yes [provider]  tiZANidine (ZANAFLEX) 4 MG tablet Take 4 mg  by mouth in the morning and at bedtime.  03/02/13  Yes [provider]    Physical Exam: Vitals:   02/10/20 0010 02/10/20 0100 02/10/20 0244 02/10/20 0247  BP: (!) 86/55 93/61 (!) 79/45 (!) 116/59  Pulse: (!) 107 (!) 101 90 92  Resp: 19 17    Temp:   98.2 F (36.8 C)   TempSrc:      SpO2: 97% 96% 98% 97%  Weight:      Height:        Constitutional: NAD, calm, comfortable Vitals:   02/10/20 0010 02/10/20 0100 02/10/20 0244 02/10/20 0247  BP: (!) 86/55 93/61 (!) 79/45 (!) 116/59  Pulse: (!) 107 (!) 101 90 92  Resp: 19 17    Temp:   98.2 F (36.8 C)   TempSrc:      SpO2: 97% 96% 98% 97%  Weight:      Height:       Eyes: PERRL, lids and conjunctivae normal ENMT: Mucous membranes are moist. Posterior pharynx clear of any exudate or lesions.Normal dentition.  Neck: normal, supple, no masses, no thyromegaly Respiratory: clear to auscultation bilaterally, no wheezing, no crackles. Normal respiratory effort. No accessory muscle use.  Cardiovascular: Regular rate and rhythm, no murmurs / rubs / gallops. No extremity edema. 2+ pedal pulses. No carotid bruits.  Abdomen: mild lower quad tenderness, no guarding ,no distention,no masses palpated. No hepatosplenomegaly. Bowel sounds positive. Abdomen soft Musculoskeletal: no clubbing / cyanosis. No joint deformity upper and lower extremities. Good ROM, no contractures. Normal muscle tone.  Skin: no rashes, lesions, ulcers. No induration Neurologic: CN 2-12 grossly intact. Sensation intact, DTR normal. Strength 5/5 in all 4.  Psychiatric: Normal judgment and insight. Alert and oriented x 3. Normal mood.    Labs on Admission: I have personally reviewed following labs and imaging studies  CBC: Recent Labs  Lab 02/09/20 1819 02/09/20 1952  WBC 9.0  --   HGB 15.5* 16.7*  HCT 46.1* 49.0*  MCV 86.5  --   PLT 390  --    Basic Metabolic Panel: Recent Labs  Lab 02/09/20 1819 02/09/20 1952  NA 137 137  K 3.5 4.5  CL 98  --     CO2 27  --   GLUCOSE 118*  --   BUN 19  --   CREATININE 0.98  --   CALCIUM 9.0  --    GFR: Estimated Creatinine Clearance: 48.1 mL/min (by C-G formula based on SCr of 0.98 mg/dL). Liver Function Tests: Recent Labs  Lab 02/09/20 1819  AST 25  ALT 13  ALKPHOS 81  BILITOT 0.9  PROT 8.2*  ALBUMIN 3.5   No results for input(s): LIPASE, AMYLASE in the last 168 hours. Recent Labs  Lab 02/09/20 2253  AMMONIA 22   Coagulation Profile: No results for input(s): INR, PROTIME in the last 168 hours. Cardiac Enzymes: No results for input(s): CKTOTAL, CKMB, CKMBINDEX, TROPONINI in the last 168 hours. BNP (last 3 results) No results for input(s): PROBNP in the last 8760 hours. HbA1C: No results for input(s): HGBA1C in the last 72 hours. CBG: Recent Labs  Lab 02/09/20 2317  GLUCAP 101*   Lipid Profile: No results for input(s): CHOL, HDL, LDLCALC, TRIG, CHOLHDL, LDLDIRECT in the last 72 hours. Thyroid Function Tests: No results for input(s): TSH, T4TOTAL, FREET4, T3FREE, THYROIDAB in the last 72 hours. Anemia Panel: No results for input(s): VITAMINB12, FOLATE, FERRITIN, TIBC, IRON, RETICCTPCT in the last 72 hours. Urine analysis:    Component Value Date/Time   COLORURINE YELLOW 02/09/2020 1837   APPEARANCEUR CLEAR 02/09/2020 1837   LABSPEC 1.020 02/09/2020 1837   PHURINE 5.5 02/09/2020 1837   GLUCOSEU NEGATIVE 02/09/2020 1837   HGBUR NEGATIVE 02/09/2020 1837   BILIRUBINUR NEGATIVE 02/09/2020 1837   KETONESUR NEGATIVE 02/09/2020 1837   PROTEINUR NEGATIVE 02/09/2020 1837   NITRITE NEGATIVE 02/09/2020 1837   LEUKOCYTESUR TRACE (A) 02/09/2020 1837    Radiological Exams on Admission: CT Angio Chest PE W and/or Wo Contrast  Result Date: 02/09/2020 CLINICAL DATA:  Shortness of breath and abdominal pain EXAM: CT ANGIOGRAPHY CHEST WITH CONTRAST TECHNIQUE: Multidetector CT imaging of the chest was performed using the standard protocol during bolus administration of intravenous  contrast. Multiplanar CT image reconstructions and MIPs were obtained to evaluate the vascular anatomy. CONTRAST:  OMNIPAQUE IOHEXOL 350 MG/ML SOLN COMPARISON:  January 09, 2020 FINDINGS: Cardiovascular: There is a optimal opacification of the pulmonary arteries. There is no central,segmental, or subsegmental filling defects within the pulmonary arteries. The heart is normal in size. No pericardial effusion or thickening. No evidence right heart strain. There is normal three-vessel brachiocephalic anatomy without proximal stenosis. The thoracic aorta is normal in appearance. Mediastinum/Nodes: No hilar, mediastinal, or axillary adenopathy. Thyroid gland, trachea, and esophagus demonstrate no significant findings. Lungs/Pleura: Centrilobular emphysematous changes are seen throughout both lungs. There interval slight worsening of the reticulonodular small tree-in-bud opacities at the posterior right peripheral lung base. There is slight interval improvement however in the right middle lobe tree-in-bud opacities. The left lung is otherwise clear. No pleural effusion. Upper Abdomen: No acute abnormalities present in the visualized portions of the upper abdomen. Musculoskeletal: No chest wall abnormality. No acute or significant osseous findings. Again noted is a scoliotic curvature with posterior spinal rod fixation. Review of the MIP images confirms the above findings. Abdomen/pelvis: Hepatobiliary: The liver is normal in density without focal abnormality.The main portal vein is patent. No evidence of calcified gallstones, gallbladder wall thickening or biliary dilatation. Pancreas: Unremarkable. No pancreatic ductal dilatation or surrounding inflammatory changes. Spleen: Normal in size without focal abnormality. Adrenals/Urinary Tract: Both adrenal glands appear normal. The kidneys and collecting system appear normal without evidence of urinary tract calculus or hydronephrosis. Bladder is unremarkable.  Stomach/Bowel: The stomach is unremarkable. There is a air-filled duodenal diverticulum seen at the posterior third portion of the duodenum. The remainder of the small bowel is grossly unremarkable. There appears to be mild wall thickening with hyperenhancement seen of the sigmoid colon and rectum with question of mild mesenteric surrounding edema. No definite surrounding free air or loculated fluid collections however are noted. Vascular/Lymphatic: There are no enlarged mesenteric, retroperitoneal, or pelvic lymph nodes. Scattered aortic atherosclerotic calcifications are seen without aneurysmal dilatation. Reproductive: The uterus and adnexa are unremarkable. Other: No evidence of abdominal wall mass or hernia. Musculoskeletal: Posterior spinal rod fixation from scoliotic surgery seen. There is anterior interbody fusion at L5-S1. There appears to be a significant periprosthetic lucency seen around the left sacroiliac screw measuring up to 8 mm. IMPRESSION: 1. No central, segmental, or subsegmental pulmonary embolism. 2. Mild new reticulonodular/tree-in-bud opacities at the posterior  peripheral right lung base in comparison to the prior exam which could be due to an infectious or inflammatory process. 3.  Emphysema (ICD10-J43.9). 4. 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 No surrounding free air or loculated fluid collections. No evidence of obstruction 5. 8 mm periprosthetic lucency around the left sacroiliac screw which could be due to loosening. 6.  Aortic Atherosclerosis (ICD10-I70.0). Electronically Signed   By: Prudencio Pair M.D.   On: 02/09/2020 23:11   CT ABDOMEN PELVIS W CONTRAST  Result Date: 02/09/2020 CLINICAL DATA:  Shortness of breath and abdominal pain EXAM: CT ANGIOGRAPHY CHEST WITH CONTRAST TECHNIQUE: Multidetector CT imaging of the chest was performed using the standard protocol during bolus administration of  intravenous contrast. Multiplanar CT image reconstructions and MIPs were obtained to evaluate the vascular anatomy. CONTRAST:  177mL OMNIPAQUE IOHEXOL 350 MG/ML SOLN COMPARISON:  January 09, 2020 FINDINGS: Cardiovascular: There is a optimal opacification of the pulmonary arteries. There is no central,segmental, or subsegmental filling defects within the pulmonary arteries. The heart is normal in size. No pericardial effusion or thickening. No evidence right heart strain. There is normal three-vessel brachiocephalic anatomy without proximal stenosis. The thoracic aorta is normal in appearance. Mediastinum/Nodes: No hilar, mediastinal, or axillary adenopathy. Thyroid gland, trachea, and esophagus demonstrate no significant findings. Lungs/Pleura: Centrilobular emphysematous changes are seen throughout both lungs. There interval slight worsening of the reticulonodular small tree-in-bud opacities at the posterior right peripheral lung base. There is slight interval improvement however in the right middle lobe tree-in-bud opacities. The left lung is otherwise clear. No pleural effusion. Upper Abdomen: No acute abnormalities present in the visualized portions of the upper abdomen. Musculoskeletal: No chest wall abnormality. No acute or significant osseous findings. Again noted is a scoliotic curvature with posterior spinal rod fixation. Review of the MIP images confirms the above findings. Abdomen/pelvis: Hepatobiliary: The liver is normal in density without focal abnormality.The main portal vein is patent. No evidence of calcified gallstones, gallbladder wall thickening or biliary dilatation. Pancreas: Unremarkable. No pancreatic ductal dilatation or surrounding inflammatory changes. Spleen: Normal in size without focal abnormality. Adrenals/Urinary Tract: Both adrenal glands appear normal. The kidneys and collecting system appear normal without evidence of urinary tract calculus or hydronephrosis. Bladder is unremarkable.  Stomach/Bowel: The stomach is unremarkable. There is a air-filled duodenal diverticulum seen at the posterior third portion of the duodenum. The remainder of the small bowel is grossly unremarkable. There appears to be mild wall thickening with hyperenhancement seen of the sigmoid colon and rectum with question of mild mesenteric surrounding edema. No definite surrounding free air or loculated fluid collections however are noted. Vascular/Lymphatic: There are no enlarged mesenteric, retroperitoneal, or pelvic lymph nodes. Scattered aortic atherosclerotic calcifications are seen without aneurysmal dilatation. Reproductive: The uterus and adnexa are unremarkable. Other: No evidence of abdominal wall mass or hernia. Musculoskeletal: Posterior spinal rod fixation from scoliotic surgery seen. There is anterior interbody fusion at L5-S1. There appears to be a significant periprosthetic lucency seen around the left sacroiliac screw measuring up to 8 mm. IMPRESSION: 1. No central, segmental, or subsegmental pulmonary embolism. 2. Mild new reticulonodular/tree-in-bud opacities at the posterior peripheral right lung base in comparison to the prior exam which could be due to an infectious or inflammatory process. 3.  Emphysema (ICD10-J43.9). 4. 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 No surrounding free air or loculated fluid collections. No  evidence of obstruction 5. 8 mm periprosthetic lucency around the left sacroiliac screw which could be due to loosening. 6.  Aortic Atherosclerosis (ICD10-I70.0). Electronically Signed   By: Jonna Clark M.D.   On: 02/09/2020 23:11   DG Chest Portable 1 View  Result Date: 02/09/2020 CLINICAL DATA:  Shortness of breath EXAM: PORTABLE CHEST 1 VIEW COMPARISON:  01/24/2019 FINDINGS: Cardiac shadow is within normal limits. Orthopedic hardware is again noted and stable. Some fractures of fixation rods are  seen but chronic. No infiltrate or sizable effusion is seen. No acute bony abnormality is noted. Stable scoliosis is noted. IMPRESSION: None no acute abnormality noted. Chronic changes as described above. Electronically Signed   By: Alcide Clever M.D.   On: 02/09/2020 19:14    EKG: Independently reviewed.  Sinus tachycardia , no hyperacute st-twave changes  Assessment/Plan  Sepsis without shock  -due to HCAP/Protocolitis -place on sepsis protocol  -s/p goal directed therapy in ed  - will continue ivfs overnight  -continue broad spectrum abx  -trend lactic acid per protocol   HCAP /partially treated vs  pna related to recurrent aspiration - place on HCAP protocol  - broad spectrum abx  De-escalate as able  -concern for possible partially treated CAP/HCAP -ct findings noted in same area on last admission -patient w/o fever , but per ed MDhad episode of hypoxemia -currently on floor patient high 90's off O2 -f/u on culture data /and de-escalate therapy as able  - supportive care with O2 , wean as able  -speech to evaluate for aspiration    Acute COPD exacerbation  -mild , prednisone 40 mg po dailyx 5 days -resume controller home medication medication   Abnormal CE  -ekg w/o hyperacute changes -most likely demand from hypoxemia  - continue to cycle CE  -further evaluation based on trend   Proctocolitis  -gi panel  -continue with metronidazole   Chronic pain back pain with hx of multiple surgeries  -holding sedative medications currently  -per noted patient not taking opioid medications -of note u tox negative for opioids   Anxiety  -continue on benzo at lower dose with parameters     DVT prophylaxis: lovenox  Code Status:Full Family Communication: N/A Disposition Plan: 3-4 days  Consults called:n/a Admission status: inpatient    Lurline Del MD [facility]  If 7PM-7AM, please contact night-coverage [email or web address] Password [facility]  02/10/2020, 3:54  AM

## 2020-02-10 NOTE — ED Notes (Signed)
Husband Joe updated via telephone [phone]

## 2020-02-11 ENCOUNTER — Inpatient Hospital Stay (HOSPITAL_COMMUNITY): Payer: PPO

## 2020-02-11 LAB — C DIFFICILE QUICK SCREEN W PCR REFLEX
C Diff antigen: POSITIVE — AB
C Diff toxin: NEGATIVE

## 2020-02-11 LAB — GASTROINTESTINAL PANEL BY PCR, STOOL (REPLACES STOOL CULTURE)

## 2020-02-11 LAB — BASIC METABOLIC PANEL
Anion gap: 9 (ref 5–15)
BUN: 8 mg/dL (ref 6–20)
CO2: 24 mmol/L (ref 22–32)
Calcium: 8.3 mg/dL — ABNORMAL LOW (ref 8.9–10.3)
Chloride: 107 mmol/L (ref 98–111)
Creatinine, Ser: 0.49 mg/dL (ref 0.44–1.00)
GFR calc Af Amer: >60 mL/min (ref >60)
GFR calc non Af Amer: >60 mL/min (ref >60)
Glucose, Bld: 149 mg/dL — ABNORMAL HIGH (ref 70–99)
Potassium: 3.2 mmol/L — ABNORMAL LOW (ref 3.5–5.1)
Sodium: 140 mmol/L (ref 135–145)

## 2020-02-11 LAB — CLOSTRIDIUM DIFFICILE BY PCR, REFLEXED: Toxigenic C. Difficile by PCR: POSITIVE — AB

## 2020-02-11 LAB — MAGNESIUM: Magnesium: 1.8 mg/dL (ref 1.7–2.4)

## 2020-02-11 LAB — CBC
HCT: 34.3 % — ABNORMAL LOW (ref 36.0–46.0)
Hemoglobin: 11 g/dL — ABNORMAL LOW (ref 12.0–15.0)
MCH: 28.6 pg (ref 26.0–34.0)
MCHC: 32.1 g/dL (ref 30.0–36.0)
MCV: 89.3 fL (ref 80.0–100.0)
Platelets: 298 10*3/uL (ref 150–400)
RBC: 3.84 MIL/uL — ABNORMAL LOW (ref 3.87–5.11)
RDW: 14.6 % (ref 11.5–15.5)
WBC: 11.4 10*3/uL — ABNORMAL HIGH (ref 4.0–10.5)
nRBC: 0 % (ref 0.0–0.2)

## 2020-02-11 LAB — LEGIONELLA PNEUMOPHILA SEROGP 1 UR AG: L. pneumophila Serogp 1 Ur Ag: NEGATIVE

## 2020-02-11 LAB — MRSA PCR SCREENING: MRSA by PCR: NEGATIVE

## 2020-02-11 MED ORDER — POTASSIUM CHLORIDE CRYS ER 20 MEQ PO TBCR
40.0000 meq | EXTENDED_RELEASE_TABLET | ORAL | Status: AC
  Administered 2020-02-11 (×2): 40 meq via ORAL
  Filled 2020-02-11 (×2): qty 2

## 2020-02-11 MED ORDER — SODIUM CHLORIDE 0.9 % IV SOLN
1.0000 g | INTRAVENOUS | Status: DC
  Administered 2020-02-11: 1 g via INTRAVENOUS
  Filled 2020-02-11: qty 1
  Filled 2020-02-11: qty 10

## 2020-02-11 MED ORDER — ARIPIPRAZOLE 5 MG PO TABS
5.0000 mg | ORAL_TABLET | Freq: Every day | ORAL | Status: DC
  Administered 2020-02-11: 5 mg via ORAL
  Filled 2020-02-11 (×2): qty 1

## 2020-02-11 MED ORDER — MAGNESIUM OXIDE 400 (241.3 MG) MG PO TABS
800.0000 mg | ORAL_TABLET | Freq: Once | ORAL | Status: AC
  Administered 2020-02-11: 800 mg via ORAL
  Filled 2020-02-11: qty 2

## 2020-02-11 MED ORDER — ARIPIPRAZOLE 5 MG PO TABS: 5.0000 mg | ORAL_TABLET | Freq: Every day | ORAL | Status: DC

## 2020-02-11 NOTE — Progress Notes (Signed)
PROGRESS NOTE    Jennifer Mcdaniel  AOZ:[id number] DOB: [birth year] DOA: 02/09/2020 PCP: Patient, No Pcp Per   Brief Narrative:  59 year old with history of emphysema, anxiety, depression, scoliosis, chronic back pain issues status post thoracolumbar surgery recently was also admitted at [facility] for sepsis secondary to CAP with COPD exacerbation from 4/29-5/3.  Was discharged on 7 days of antibiotic and prednisone taper.  After going home continued to feel worse with progressive shortness of breath.  In the ER CTA chest abdomen pelvis was negative for PE but showed new reticular nodular tree-in-bud opacity, emphysema, proctocolitis.  GI panel negative.   Assessment & Plan:   Principal Problem:   Sepsis ([place]) Active Problems:   Pneumonia   Depression   Proctocolitis   Lactic acidosis   Acute lower UTI   COPD with acute exacerbation (HCC)   Sepsis secondary to HCAP, proctocolitis and mild urinary tract infection Acute COPD exacerbation Lactic acidosis, improved Follow-up culture data.  Supplemental oxygen Incentive spirometer, flutter valve Prednisone, scheduled and as needed bronchodilators, Dulera Antibiotics-discontinue vancomycin, cefepime. Will do Rocephin & Flagyl Speech and swallow evaluation-mild aspiration risk GI panel-negative C. difficile-pending Oral prednisone 40 mg daily x 5 days  Nonspecific diarrhea -GI panel is negative.  C. Difficile"pending; need to rule it out due to recent antibiotic use  Elevated troponin, barely elevated.  Demand ischemia.  Remains flat without chest pain.  Nonischemic EKG  History of chronic pain Scoliosis with thoracolumbar surgery Pain control.  Bowel regimen.  Anxiety/Depression Resume home meds.  As needed benzos if necessary.  DVT prophylaxis: Lovenox Code Status: Full code Family Communication: None  Status is: Inpatient  Remains inpatient appropriate because:Inpatient level of care  appropriate due to severity of illness   Dispo: The patient is from: Home              Anticipated d/c is to: Home              Anticipated d/c date is:               Patient currently is not medically stable to d/c.  Monitor cultures for another 24 hours.  If remains stable, will de-escalate to oral antibiotics and discharge her in stable condition.  In the meantime rule out C. difficile.  Subjective: Feels little better than yesterday.  Still had couple loose stools over last 24 hours.  Goes on told me that this is somewhat normal for her but she understands we need to rule out C. difficile given recent antibiotic use.  Review of Systems Otherwise negative except as per HPI, including: General: Denies fever, chills, night sweats or unintended weight loss. Resp: Denies hemoptysis Cardiac: Denies chest pain, palpitations, orthopnea, paroxysmal nocturnal dyspnea. GI: Denies abdominal pain, nausea, vomiting, diarrhea or constipation GU: Denies dysuria, frequency, hesitancy or incontinence MS: Denies muscle aches, joint pain or swelling Neuro: Denies headache, neurologic deficits (focal weakness, numbness, tingling), abnormal gait Psych: Denies anxiety, depression, SI/HI/AVH Skin: Denies new rashes or lesions ID: Denies sick contacts, exotic exposures, travel  Examination: Constitutional: Not in acute distress Respiratory: Mild bilateral rhonchi Cardiovascular: Normal sinus rhythm, no rubs Abdomen: Nontender nondistended good bowel sounds Musculoskeletal: No edema noted Skin: No rashes seen Neurologic: CN 2-12 grossly intact.  And nonfocal Psychiatric: Normal judgment and insight. Alert and oriented x 3. Normal mood.  Objective: Vitals:   02/10/20 2001 02/10/20 2153 02/11/20 0202 02/11/20 0633  BP:  (!) 110/54 104/64 112/72  Pulse:  Marland Kitchen)  103 100 98  Resp:  18 18 18   Temp:  97.6 F (36.4 C) 97.7 F (36.5 C) 97.8 F (36.6 C)  TempSrc:  Oral Oral Oral  SpO2: 95% 92% 95% 97%    Weight:      Height:        Intake/Output Summary (Last 24 hours) at 02/11/2020 0746 Last data filed at 02/11/2020 02/13/2020 Gross per 24 hour  Intake 3106.14 ml  Output 800 ml  Net 2306.14 ml   Filed Weights   02/09/20 1803  Weight: 48.1 kg     Data Reviewed:   CBC: Recent Labs  Lab 02/09/20 1819 02/09/20 1952 02/11/20 0413  WBC 9.0  --  11.4*  HGB 15.5* 16.7* 11.0*  HCT 46.1* 49.0* 34.3*  MCV 86.5  --  89.3  PLT 390  --  298   Basic Metabolic Panel: Recent Labs  Lab 02/09/20 1819 02/09/20 1952 02/11/20 0413  NA 137 137 140  K 3.5 4.5 3.2*  CL 98  --  107  CO2 27  --  24  GLUCOSE 118*  --  149*  BUN 19  --  8  CREATININE 0.98  --  0.49  CALCIUM 9.0  --  8.3*  MG  --   --  1.8   GFR: Estimated Creatinine Clearance: 58.9 mL/min (by C-G formula based on SCr of 0.49 mg/dL). Liver Function Tests: Recent Labs  Lab 02/09/20 1819  AST 25  ALT 13  ALKPHOS 81  BILITOT 0.9  PROT 8.2*  ALBUMIN 3.5   No results for input(s): LIPASE, AMYLASE in the last 168 hours. Recent Labs  Lab 02/09/20 2253  AMMONIA 22   Coagulation Profile: Recent Labs  Lab 02/10/20 0522  INR 1.2   Cardiac Enzymes: No results for input(s): CKTOTAL, CKMB, CKMBINDEX, TROPONINI in the last 168 hours. BNP (last 3 results) No results for input(s): PROBNP in the last 8760 hours. HbA1C: No results for input(s): HGBA1C in the last 72 hours. CBG: Recent Labs  Lab 02/09/20 2317  GLUCAP 101*   Lipid Profile: No results for input(s): CHOL, HDL, LDLCALC, TRIG, CHOLHDL, LDLDIRECT in the last 72 hours. Thyroid Function Tests: No results for input(s): TSH, T4TOTAL, FREET4, T3FREE, THYROIDAB in the last 72 hours. Anemia Panel: No results for input(s): VITAMINB12, FOLATE, FERRITIN, TIBC, IRON, RETICCTPCT in the last 72 hours. Sepsis Labs: Recent Labs  Lab 02/09/20 1923 02/09/20 2110 02/10/20 0522 02/10/20 0720  PROCALCITON  --   --   --  2.07  LATICACIDVEN 2.1* 1.4 1.1 0.8     Recent Results (from the past 240 hour(s))  SARS Coronavirus 2 by RT PCR (hospital order, performed in [facility] hospital lab) Nasopharyngeal Nasopharyngeal Swab     Status: None   Collection Time: 02/09/20  7:23 PM   Specimen: Nasopharyngeal Swab  Result Value Ref Range Status   SARS Coronavirus 2 NEGATIVE NEGATIVE Final    Comment: (NOTE) SARS-CoV-2 target nucleic acids are NOT DETECTED. The SARS-CoV-2 RNA is generally detectable in upper and lower respiratory specimens during the acute phase of infection. The lowest concentration of SARS-CoV-2 viral copies this assay can detect is 250 copies / mL. A negative result does not preclude SARS-CoV-2 infection and should not be used as the sole basis for treatment or other patient management decisions.  A negative result may occur with improper specimen collection / handling, submission of specimen other than nasopharyngeal swab, presence of viral mutation(s) within the areas targeted by this assay, and  inadequate number of viral copies (<250 copies / mL). A negative result must be combined with clinical observations, patient history, and epidemiological information. Fact Sheet for Patients:   BoilerBrush.com.cy Fact Sheet for Healthcare Providers: https://pope.com/ This test is not yet approved or cleared  by the [place] FDA and has been authorized for detection and/or diagnosis of SARS-CoV-2 by FDA under an Emergency Use Authorization (EUA).  This EUA will remain in effect (meaning this test can be used) for the duration of the COVID-19 declaration under Section 564(b)(1) of the Act, 21 U.S.C. section 360bbb-3(b)(1), unless the authorization is terminated or revoked sooner. Performed at [facility], [address] [place], [place] [address]   Expectorated sputum assessment w rflx to resp cult     Status: None   Collection Time: 02/10/20  3:54 AM   Specimen:  Sputum  Result Value Ref Range Status   Specimen Description SPUTUM  Final   Special Requests NONE  Final   Sputum evaluation   Final    Sputum specimen not acceptable for testing.  Please recollect.   SPOKE WITH BARBARA ON 3E AND TOLD HER WE NEEDED A RECOLLECT 1132 02/10/20 JM Performed at [facility], [address], [place], [place] [address]    Report Status 02/10/2020 FINAL  Final  Gram stain     Status: None   Collection Time: 02/10/20  3:54 AM   Specimen: SPU  Result Value Ref Range Status   Specimen Description   Final    SPUTUM Performed at [facility], [address], [place], [place] [address]    Special Requests   Final    NONE Performed at [facility], [address], [place], [place] [address]    Gram Stain   Final    RARE WBC PRESENT,BOTH PMN AND MONONUCLEAR RARE GRAM POSITIVE RODS Performed at [facility], [address], [place], [place] [address]    Report Status 02/10/2020 FINAL  Final         Radiology Studies: CT Angio Chest PE W and/or Wo Contrast  Result Date: 02/09/2020 CLINICAL DATA:  Shortness of breath and abdominal pain EXAM: CT ANGIOGRAPHY CHEST WITH CONTRAST TECHNIQUE: Multidetector CT imaging of the chest was performed using the standard protocol during bolus administration of intravenous contrast. Multiplanar CT image reconstructions and MIPs were obtained to evaluate the vascular anatomy. CONTRAST:  OMNIPAQUE IOHEXOL 350 MG/ML SOLN COMPARISON:  January 09, 2020 FINDINGS: Cardiovascular: There is a optimal opacification of the pulmonary arteries. There is no central,segmental, or subsegmental filling defects within the pulmonary arteries. The heart is normal in size. No pericardial effusion or thickening. No evidence right heart strain. There is normal three-vessel brachiocephalic anatomy without proximal stenosis. The thoracic aorta is normal in appearance. Mediastinum/Nodes: No  hilar, mediastinal, or axillary adenopathy. Thyroid gland, trachea, and esophagus demonstrate no significant findings. Lungs/Pleura: Centrilobular emphysematous changes are seen throughout both lungs. There interval slight worsening of the reticulonodular small tree-in-bud opacities at the posterior right peripheral lung base. There is slight interval improvement however in the right middle lobe tree-in-bud opacities. The left lung is otherwise clear. No pleural effusion. Upper Abdomen: No acute abnormalities present in the visualized portions of the upper abdomen. Musculoskeletal: No chest wall abnormality. No acute or significant osseous findings. Again noted is a scoliotic curvature with posterior spinal rod fixation. Review of the MIP images confirms the above findings. Abdomen/pelvis: Hepatobiliary: The liver is normal in  density without focal abnormality.The main portal vein is patent. No evidence of calcified gallstones, gallbladder wall thickening or biliary dilatation. Pancreas: Unremarkable. No pancreatic ductal dilatation or surrounding inflammatory changes. Spleen: Normal in size without focal abnormality. Adrenals/Urinary Tract: Both adrenal glands appear normal. The kidneys and collecting system appear normal without evidence of urinary tract calculus or hydronephrosis. Bladder is unremarkable. Stomach/Bowel: The stomach is unremarkable. There is a air-filled duodenal diverticulum seen at the posterior third portion of the duodenum. The remainder of the small bowel is grossly unremarkable. There appears to be mild wall thickening with hyperenhancement seen of the sigmoid colon and rectum with question of mild mesenteric surrounding edema. No definite surrounding free air or loculated fluid collections however are noted. Vascular/Lymphatic: There are no enlarged mesenteric, retroperitoneal, or pelvic lymph nodes. Scattered aortic atherosclerotic calcifications are seen without aneurysmal dilatation.  Reproductive: The uterus and adnexa are unremarkable. Other: No evidence of abdominal wall mass or hernia. Musculoskeletal: Posterior spinal rod fixation from scoliotic surgery seen. There is anterior interbody fusion at L5-S1. There appears to be a significant periprosthetic lucency seen around the left sacroiliac screw measuring up to 8 mm. IMPRESSION: 1. No central, segmental, or subsegmental pulmonary embolism. 2. Mild new reticulonodular/tree-in-bud opacities at the posterior peripheral right lung base in comparison to the prior exam which could be due to an infectious or inflammatory process. 3.  Emphysema (ICD10-J43.9). 4. 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 No surrounding free air or loculated fluid collections. No evidence of obstruction 5. 8 mm periprosthetic lucency around the left sacroiliac screw which could be due to loosening. 6.  Aortic Atherosclerosis (ICD10-I70.0). Electronically Signed   By: Jonna Clark M.D.   On: 02/09/2020 23:11   CT ABDOMEN PELVIS W CONTRAST  Result Date: 02/09/2020 CLINICAL DATA:  Shortness of breath and abdominal pain EXAM: CT ANGIOGRAPHY CHEST WITH CONTRAST TECHNIQUE: Multidetector CT imaging of the chest was performed using the standard protocol during bolus administration of intravenous contrast. Multiplanar CT image reconstructions and MIPs were obtained to evaluate the vascular anatomy. CONTRAST:  OMNIPAQUE IOHEXOL 350 MG/ML SOLN COMPARISON:  January 09, 2020 FINDINGS: Cardiovascular: There is a optimal opacification of the pulmonary arteries. There is no central,segmental, or subsegmental filling defects within the pulmonary arteries. The heart is normal in size. No pericardial effusion or thickening. No evidence right heart strain. There is normal three-vessel brachiocephalic anatomy without proximal stenosis. The thoracic aorta is normal in appearance. Mediastinum/Nodes:  No hilar, mediastinal, or axillary adenopathy. Thyroid gland, trachea, and esophagus demonstrate no significant findings. Lungs/Pleura: Centrilobular emphysematous changes are seen throughout both lungs. There interval slight worsening of the reticulonodular small tree-in-bud opacities at the posterior right peripheral lung base. There is slight interval improvement however in the right middle lobe tree-in-bud opacities. The left lung is otherwise clear. No pleural effusion. Upper Abdomen: No acute abnormalities present in the visualized portions of the upper abdomen. Musculoskeletal: No chest wall abnormality. No acute or significant osseous findings. Again noted is a scoliotic curvature with posterior spinal rod fixation. Review of the MIP images confirms the above findings. Abdomen/pelvis: Hepatobiliary: The liver is normal in density without focal abnormality.The main portal vein is patent. No evidence of calcified gallstones, gallbladder wall thickening or biliary dilatation. Pancreas: Unremarkable. No pancreatic ductal dilatation or surrounding inflammatory changes. Spleen: Normal in size without focal abnormality. Adrenals/Urinary Tract: Both adrenal glands appear normal. The kidneys and collecting system appear normal without  evidence of urinary tract calculus or hydronephrosis. Bladder is unremarkable. Stomach/Bowel: The stomach is unremarkable. There is a air-filled duodenal diverticulum seen at the posterior third portion of the duodenum. The remainder of the small bowel is grossly unremarkable. There appears to be mild wall thickening with hyperenhancement seen of the sigmoid colon and rectum with question of mild mesenteric surrounding edema. No definite surrounding free air or loculated fluid collections however are noted. Vascular/Lymphatic: There are no enlarged mesenteric, retroperitoneal, or pelvic lymph nodes. Scattered aortic atherosclerotic calcifications are seen without aneurysmal dilatation.  Reproductive: The uterus and adnexa are unremarkable. Other: No evidence of abdominal wall mass or hernia. Musculoskeletal: Posterior spinal rod fixation from scoliotic surgery seen. There is anterior interbody fusion at L5-S1. There appears to be a significant periprosthetic lucency seen around the left sacroiliac screw measuring up to 8 mm. IMPRESSION: 1. No central, segmental, or subsegmental pulmonary embolism. 2. Mild new reticulonodular/tree-in-bud opacities at the posterior peripheral right lung base in comparison to the prior exam which could be due to an infectious or inflammatory process. 3.  Emphysema (ICD10-J43.9). 4. 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 No surrounding free air or loculated fluid collections. No evidence of obstruction 5. 8 mm periprosthetic lucency around the left sacroiliac screw which could be due to loosening. 6.  Aortic Atherosclerosis (ICD10-I70.0). Electronically Signed   By: Jonna ClarkBindu  Avutu M.D.   On: 02/09/2020 23:11   DG Chest Portable 1 View  Result Date: 02/09/2020 CLINICAL DATA:  Shortness of breath EXAM: PORTABLE CHEST 1 VIEW COMPARISON:  01/24/2019 FINDINGS: Cardiac shadow is within normal limits. Orthopedic hardware is again noted and stable. Some fractures of fixation rods are seen but chronic. No infiltrate or sizable effusion is seen. No acute bony abnormality is noted. Stable scoliosis is noted. IMPRESSION: None no acute abnormality noted. Chronic changes as described above. Electronically Signed   By: Alcide CleverMark  Lukens M.D.   On: 02/09/2020 19:14        Scheduled Meds: . ALPRAZolam  0.25 mg Oral QID  . DULoxetine  120 mg Oral Daily  . enoxaparin (LOVENOX) injection  40 mg Subcutaneous Q24H  . ipratropium-albuterol  3 mL Nebulization BID  . magnesium oxide  800 mg Oral Once  . mometasone-formoterol  2 puff Inhalation BID  . nicotine  21 mg Transdermal Daily  . oxyCODONE   20 mg Oral Q12H  . potassium chloride  40 mEq Oral Q4H  . predniSONE  40 mg Oral Q breakfast  . pregabalin  200 mg Oral TID   Continuous Infusions: . sodium chloride 75 mL/hr at 02/11/20 0301  . ceFEPime (MAXIPIME) IV 2 g (02/11/20 0303)  . metronidazole 500 mg (02/10/20 2356)  . vancomycin 1,000 mg (02/10/20 2357)     LOS: 1 day   Time spent= 40 mins    Jennifer Fetsch Joline Maxcyhirag Janasha Barkalow, MD [facility]  If 7PM-7AM, please contact night-coverage  02/11/2020, 7:46 AM

## 2020-02-11 NOTE — Progress Notes (Signed)
Modified Barium Swallow Progress Note  Patient Details  Name: Jennifer Mcdaniel MRN: [id number] Date of Birth: [birth year]  Today's Date: 02/11/2020  Modified Barium Swallow completed.  Full report located under Chart Review in the Imaging Section.  Brief recommendations include the following:  Clinical Impression  Pt was seen for a modified barium swallow study and she presents with overall functional oropharyngeal swallowing abilities.  She consumed thin liquid, puree, and regular solids during this examination.  One episode of trace laryngeal penetration above the vocal cords was observed between trials when fluoro was not on; otherwise, no laryngeal penetration or aspiration was observed despite challenging.  Pt was able to clear trace laryngeal penetration with a cued throat clear.  Oral phase was remarkable for mildly prolonged mastication secondary to dentition, piecemeal deglutition, and minimally reduced lingual strength resulting in intermittent trace oral residue.  Pharyngeal phase was remarkable for reduced BOT retraction and reduced hyolaryngeal excursion resulting in vallecular residue.  Pharyngoesophageal phase was remarkable for suspected osteophytes at the level of C5-C6, suspected prolonged esophageal clearance of the barium tablet during esophageal sweep, and trace residue below the UES across trials.  Radiologist was not present to confirm.  GI consult may be beneficial to evaluate further.  Recommend continuation of regular solids and thin liquids with medications administered whole with thin liquid and adherence to compensatory strategies (listed below).  No further skilled ST is warranted at this time.  Please re-consult if additional needs arise.    Swallow Evaluation Recommendations   Recommended Consults: Consider GI evaluation   SLP Diet Recommendations: Regular solids;Thin liquid   Liquid Administration via: Cup;Straw   Medication Administration: Whole meds with  liquid   Supervision: Patient able to self feed   Compensations: Slow rate;Small sips/bites   Postural Changes: Remain semi-upright after after feeds/meals (Comment);Seated upright at 90 degrees   Oral Care Recommendations: Oral care BID       Villa Herb M.S., CCC-SLP Acute Rehabilitation Services Office: [phone]  Jennifer Mcdaniel 02/11/2020,1:26 PM

## 2020-02-12 DIAGNOSIS — A498 Other bacterial infections of unspecified site: Secondary | ICD-10-CM

## 2020-02-12 LAB — CBC
HCT: 33 % — ABNORMAL LOW (ref 36.0–46.0)
Hemoglobin: 10.7 g/dL — ABNORMAL LOW (ref 12.0–15.0)
MCH: 28.9 pg (ref 26.0–34.0)
MCHC: 32.4 g/dL (ref 30.0–36.0)
MCV: 89.2 fL (ref 80.0–100.0)
Platelets: 284 10*3/uL (ref 150–400)
RBC: 3.7 MIL/uL — ABNORMAL LOW (ref 3.87–5.11)
RDW: 14.8 % (ref 11.5–15.5)
WBC: 13.8 10*3/uL — ABNORMAL HIGH (ref 4.0–10.5)
nRBC: 0 % (ref 0.0–0.2)

## 2020-02-12 LAB — BASIC METABOLIC PANEL
Anion gap: 8 (ref 5–15)
BUN: 12 mg/dL (ref 6–20)
CO2: 25 mmol/L (ref 22–32)
Calcium: 8.3 mg/dL — ABNORMAL LOW (ref 8.9–10.3)
Chloride: 108 mmol/L (ref 98–111)
Creatinine, Ser: 0.53 mg/dL (ref 0.44–1.00)
GFR calc Af Amer: >60 mL/min (ref >60)
GFR calc non Af Amer: >60 mL/min (ref >60)
Glucose, Bld: 99 mg/dL (ref 70–99)
Potassium: 3.4 mmol/L — ABNORMAL LOW (ref 3.5–5.1)
Sodium: 141 mmol/L (ref 135–145)

## 2020-02-12 LAB — MAGNESIUM: Magnesium: 1.9 mg/dL (ref 1.7–2.4)

## 2020-02-12 MED ORDER — PREDNISONE 20 MG PO TABS: 40.0000 mg | ORAL_TABLET | Freq: Every day | ORAL | 0 refills | Status: AC

## 2020-02-12 MED ORDER — SACCHAROMYCES BOULARDII 250 MG PO CAPS: 250.0000 mg | ORAL_CAPSULE | Freq: Two times a day (BID) | ORAL | 0 refills | Status: AC

## 2020-02-12 MED ORDER — VANCOMYCIN HCL 125 MG PO CAPS: 125.0000 mg | ORAL_CAPSULE | Freq: Four times a day (QID) | ORAL | 0 refills | Status: AC

## 2020-02-12 MED ORDER — VANCOMYCIN 50 MG/ML ORAL SOLUTION
125.0000 mg | Freq: Four times a day (QID) | ORAL | Status: DC
  Administered 2020-02-12: 125 mg via ORAL
  Filled 2020-02-12 (×3): qty 2.5

## 2020-02-12 MED ORDER — POTASSIUM CHLORIDE CRYS ER 20 MEQ PO TBCR
40.0000 meq | EXTENDED_RELEASE_TABLET | Freq: Once | ORAL | Status: AC
  Administered 2020-02-12: 40 meq via ORAL
  Filled 2020-02-12: qty 2

## 2020-02-12 MED ORDER — CEPHALEXIN 500 MG PO CAPS: 500.0000 mg | ORAL_CAPSULE | Freq: Four times a day (QID) | ORAL | 0 refills | Status: AC

## 2020-02-12 NOTE — Plan of Care (Signed)
Instructions were reviewed with patient. All questions were answered. Patient was transported to main entrance by wheelchair. ° °

## 2020-02-12 NOTE — Discharge Instructions (Signed)
Clostridioides Difficile Infection Clostridioides difficile, or C. diff, infection is caused by germs (bacteria). It causes irritation and swelling of the colon (colitis). This infection can spread from person to person (is contagious). You may also get C. diff from food or water, or from touching surfaces that have the germs on them. What are the causes? Certain germs live in the colon and help to digest food. This infection starts when the balance of helpful germs in the colon changes, and the C. diff germs grow out of control. This is caused by taking antibiotics. What increases the risk? Your risk is higher if you:  Take certain antibiotics that kill many types of germs.  Take antibiotics for a long time.  Stay for a long time in a health care setting, such as: ? A hospital. ? A long-term care facility.  Are older than age 65. Your risk is somewhat higher if you:  Have had C. diff infection before or had contact with C. diff germs.  Have a weak body defense system (immune system).  Take a medicine for a long time that reduces stomach acid. This includes proton pump inhibitors.  Have serious health problems, such as: ? Colon cancer. ? Inflammatory bowel disease (IBD).  Have had a procedure or surgery on your gastrointestinal (GI) tract. Some people develop C. diff even though they are not clearly at risk. What are the signs or symptoms?  Watery poop (diarrhea).  Fever.  Tiredness (fatigue).  Loss of appetite.  Nausea.  Swelling, pain, cramping, or tenderness in your belly (abdomen). How is this treated?  Stopping the antibiotics that you were taking when the C. diff infection began. Do this only as told by your doctor.  Taking certain antibiotics to stop C. diff from growing.  Taking donor poop (stool) from a healthy person and placing it into the colon. This may be done if the infection keeps coming back.  Having surgery to remove the infected part of the  colon. This is rare. Follow these instructions at home: Medicines  Take over-the-counter and prescription medicines only as told by your doctor.  Take your antibiotic medicine as told by your doctor. Do not stop taking the antibiotic even if you start to feel better.  Do not treat watery poop with medicines unless your doctor tells you to. Eating and drinking   Follow instructions from your doctor about eating or drinking.  Eat bland foods in small amounts as you are able. These foods include: ? Bananas. ? Applesauce. ? Rice. ? Low-fat (lean) meats. ? Toast. ? Crackers.  Follow your doctor's instructions on how to get enough fluids into your body. You may need to: ? Drink clear fluids. This includes water, fruit juice you have added water to, or low-calorie sports drinks. ? Suck on ice chips. ? Take an ORS (oral rehydration solution).  Avoid milk, caffeine, and alcohol.  Drink enough fluid to keep your pee (urine) pale yellow. Activity  Rest as told by your doctor.  Return to your normal activities as told by your doctor. Ask your doctor what activities are safe for you. General instructions  Wash your hands often with soap and water. Do this for at least 20 seconds. Bathe using soap and water daily.  Be sure your home is clean before you leave the hospital or clinic to go home.  Continue daily cleaning for at least a week after going home.  Keep all follow-up visits as told by your doctor. This is important.   How is this prevented? Hand hygiene   Wash your hands well before you cook and after you use the bathroom. Use soap and water for at least 20 seconds. Make sure that people who live with you also wash their hands often.  If you are being treated at a hospital or clinic, make sure that: ? All doctors and nurses wash their hands with soap and water before touching you. ? All visitors wash their hands with soap and water before touching you. Contact  precautions  If you get watery poop while you are in the hospital or a long-term care facility, let your doctor know right away.  When you visit someone in the hospital or a long-term care facility, follow the rules for wearing a gown, gloves, or other protective equipment.  If possible, avoid contact with people who have watery poop.  If you are sick and live with other people, use a separate bathroom, if you can. Clean environment  Clean surfaces that are touched often every day. Use a product that has chlorine bleach in it. The bleach should be 10% solution. Be sure to: ? Read the instructions to find out if the product you are using will work on what you are cleaning. ? Clean toilets, bathtubs, sinks, door knobs, and work surfaces.  If you are in the hospital, make sure that the staff cleans the surfaces in your room each day. Tell someone right away if body fluids have splashed or spilled. Washing clothes and linens  Use laundry soap that has chlorine bleach in it to wash clothes and linens. Be sure to: ? Use powder soap instead of liquid. ? Run your washing machine on the hot setting with nothing but soap in it. Do this once a month. Contact a doctor if:  Your symptoms do not get better or they get worse.  Your symptoms go away and then come back.  You have a fever.  You have new symptoms. Get help right away if:  You have more pain or tenderness in your belly.  Your poop is mostly bloody.  Your poop looks dark black and tarry.  You cannot eat or drink without vomiting.  You have signs of not having enough fluids in your body. These include: ? Dark pee, very little pee, or no pee. ? Cracked lips or dry mouth. ? No tears when you cry. ? Sunken eyes. ? Feeling sleepy. ? Feeling weak or dizzy. Summary  C. diff infection may happen after taking antibiotic medicines.  Symptoms include watery poop, fever, tiredness, loss of appetite, nausea, and belly  problems.  Treatment starts with stopping the antibiotics you were using when the infection began. Certain antibiotics are then used to stop C. diff from growing.  This infection is sometimes treated by placing donor poop into the colon or doing surgery.  Washing hands and cleaning every day can help keep C. diff from spreading. This information is not intended to replace advice given to you by your health care provider. Make sure you discuss any questions you have with your health care provider. Document Revised: 02/07/2019 Document Reviewed: 02/07/2019 [employer] Patient Education  2020 [employer]  

## 2020-02-12 NOTE — Discharge Summary (Signed)
Physician Discharge Summary  Jennifer Mcdaniel ZOX:[id number] DOB: 1961-01-30 DOA: 02/09/2020  PCP: Patient, No Pcp Per  Admit date: 02/09/2020 Discharge date: 02/12/2020  Admitted From: Home Disposition:  Home   Recommendations for Outpatient Follow-up:  1. Follow up with PCP in 2 weeks 2. Please obtain BMP/CBC in one week your next doctors visit.  3. Vancomycin PO 10 days  4. Keflex PO x 4 days 5. Prednisone PO x 2 days  6. Florastor after completion of Antibiotics course  7. Counseled to quit smoking   Discharge Condition: Stable CODE STATUS: Full Diet recommendation: Heart healthy  Brief/Interim Summary: 59 year old with history of emphysema, anxiety, depression, scoliosis, chronic back pain issues status post thoracolumbar surgery recently was also admitted at [facility] for sepsis secondary to CAP with COPD exacerbation from 4/29-5/3.  Was discharged on 7 days of antibiotic and prednisone taper.  After going home continued to feel worse with progressive shortness of breath.  In the ER CTA chest abdomen pelvis was negative for PE but showed new reticular nodular tree-in-bud opacity, emphysema, proctocolitis.  GI panel negative.  C. difficile was positive for antigen, toxigenic strain but negative for toxin.  She was having 7 bowel movements per day with mild leukocytosis.  Discussed with infectious disease recommended treating this is hospital associated C. difficile.  She was prescribed 10 days of oral vancomycin with rest of the recommendations as stated above.  Stable for discharge today.   Assessment & Plan:   Principal Problem:   Sepsis (HCC) Active Problems:   Pneumonia   Depression   Proctocolitis   Lactic acidosis   Acute lower UTI   COPD with acute exacerbation (HCC)   Sepsis secondary to HCAP, proctocolitis and mild urinary tract infection Acute COPD exacerbation Lactic acidosis, improved Greatly improved, culture data remains  negative.  No longer requiring oxygen. Incentive spirometer, flutter valve Prednisone-2 more days, scheduled and as needed bronchodilators, Dulera Antibiotics-oral vancomycin for 10 days, and Keflex for 4 more days.  Probiotic prescribed to take at the end of her antibiotic regimen. Speech and swallow evaluation-mild aspiration risk  Hospital ASSOCIATED C. difficile infection (2wks since previous hospitalization) Proctocolitis -GI panel is negative.  C. Difficile-negative toxin, positive antigen and toxigenic PCR.  CT findings consistent with proctocolitis.  Case discussed with infectious disease, Dr. Ilsa Iha recommend treating with PO Vanc  X 10 days. Only having about 2 watery bowel movements daily.  Tolerating p.o. intake well.  Advised to hydrate herself at home.  Elevated troponin, barely elevated.  Demand ischemia.  Remains flat without chest pain.  Nonischemic EKG  History of chronic pain Scoliosis with thoracolumbar surgery Pain control.  Bowel regimen.  Anxiety/Depression Resume home meds.  As needed benzos if necessary.   Discharge Diagnoses:  Principal Problem:   Sepsis (HCC) Active Problems:   Pneumonia   Depression   Proctocolitis   Lactic acidosis   Acute lower UTI   COPD with acute exacerbation (HCC)   Clostridioides difficile infection; Hospital Associated.     Consultations:  Curbside infectious disease  Subjective: Patient really wants to go home today, no complaints.  Had 2 watery bowel movements in last 24 hours which she reports has been normal for her for quite some time.  Denies any abdominal pain.  Tolerating oral diet.  Discharge Exam: Vitals:   02/12/20 0611 02/12/20 0829  BP: (!) 144/94   Pulse: 81   Resp: 18   Temp:    SpO2: 96% 96%  Vitals:   02/11/20 2200 02/11/20 2200 02/12/20 0611 02/12/20 0829  BP:  (!) 141/73 (!) 144/94   Pulse:  95 81   Resp:  18 18   Temp:  98 F (36.7 C)    TempSrc:  Oral    SpO2: 100% 100% 96% 96%   Weight:      Height:        General: Pt is alert, awake, not in acute distress, cachectic frail with bilateral temporal wasting. Cardiovascular: RRR, S1/S2 +, no rubs, no gallops Respiratory: CTA bilaterally, no wheezing, no rhonchi Abdominal: Soft, NT, ND, bowel sounds + Extremities: no edema, no cyanosis  Discharge Instructions   Allergies as of 02/12/2020   No Known Allergies     Medication List    TAKE these medications   albuterol 108 (90 Base) MCG/ACT inhaler Commonly known as: VENTOLIN HFA Inhale 2 puffs into the lungs every 4 (four) hours as needed for wheezing or shortness of breath.   ALPRAZolam 0.5 MG tablet Commonly known as: XANAX Take 0.5 mg by mouth 4 (four) times daily.   ARIPiprazole 5 MG tablet Commonly known as: ABILIFY Take 5 mg by mouth daily.   budesonide-formoterol 160-4.5 MCG/ACT inhaler Commonly known as: SYMBICORT Inhale 2 puffs into the lungs in the morning and at bedtime.   cephALEXin 500 MG capsule Commonly known as: KEFLEX Take 1 capsule (500 mg total) by mouth 4 (four) times daily for 4 days.   DULoxetine 60 MG capsule Commonly known as: CYMBALTA Take 120 mg by mouth daily.   multivitamin with minerals Tabs tablet Take 1 tablet by mouth daily.   naproxen sodium 220 MG tablet Commonly known as: ALEVE Take 440 mg by mouth 2 (two) times daily as needed (pain).   nicotine 21 mg/24hr patch Commonly known as: NICODERM CQ - dosed in mg/24 hours Place 21 mg onto the skin daily.   Oxycodone HCl 10 MG Tabs Take 10 mg by mouth 2 (two) times daily as needed (pain).   OxyCONTIN 20 mg 12 hr tablet Generic drug: oxyCODONE Take 20 mg by mouth every 12 (twelve) hours.   predniSONE 20 MG tablet Commonly known as: DELTASONE Take 2 tablets (40 mg total) by mouth daily with breakfast for 2 days.   pregabalin 200 MG capsule Commonly known as: LYRICA Take 200 mg by mouth 3 (three) times daily.   saccharomyces boulardii 250 MG  capsule Commonly known as: Florastor Take 1 capsule (250 mg total) by mouth 2 (two) times daily for 10 days. Start taking on: Feb 22, 2020   tiZANidine 4 MG tablet Commonly known as: ZANAFLEX Take 4 mg by mouth in the morning and at bedtime.   vancomycin 125 MG capsule Commonly known as: VANCOCIN Take 1 capsule (125 mg total) by mouth 4 (four) times daily for 10 days.       No Known Allergies  You were cared for by a hospitalist during your hospital stay. If you have any questions about your discharge medications or the care you received while you were in the hospital after you are discharged, you can call the unit and asked to speak with the hospitalist on call if the hospitalist that took care of you is not available. Once you are discharged, your primary care physician will handle any further medical issues. Please note that no refills for any discharge medications will be authorized once you are discharged, as it is imperative that you return to your primary care physician (or establish a  relationship with a primary care physician if you do not have one) for your aftercare needs so that they can reassess your need for medications and monitor your lab values.   Procedures/Studies: CT Angio Chest PE W and/or Wo Contrast  Result Date: 02/09/2020 CLINICAL DATA:  Shortness of breath and abdominal pain EXAM: CT ANGIOGRAPHY CHEST WITH CONTRAST TECHNIQUE: Multidetector CT imaging of the chest was performed using the standard protocol during bolus administration of intravenous contrast. Multiplanar CT image reconstructions and MIPs were obtained to evaluate the vascular anatomy. CONTRAST:  OMNIPAQUE IOHEXOL 350 MG/ML SOLN COMPARISON:  January 09, 2020 FINDINGS: Cardiovascular: There is a optimal opacification of the pulmonary arteries. There is no central,segmental, or subsegmental filling defects within the pulmonary arteries. The heart is normal in size. No pericardial effusion or  thickening. No evidence right heart strain. There is normal three-vessel brachiocephalic anatomy without proximal stenosis. The thoracic aorta is normal in appearance. Mediastinum/Nodes: No hilar, mediastinal, or axillary adenopathy. Thyroid gland, trachea, and esophagus demonstrate no significant findings. Lungs/Pleura: Centrilobular emphysematous changes are seen throughout both lungs. There interval slight worsening of the reticulonodular small tree-in-bud opacities at the posterior right peripheral lung base. There is slight interval improvement however in the right middle lobe tree-in-bud opacities. The left lung is otherwise clear. No pleural effusion. Upper Abdomen: No acute abnormalities present in the visualized portions of the upper abdomen. Musculoskeletal: No chest wall abnormality. No acute or significant osseous findings. Again noted is a scoliotic curvature with posterior spinal rod fixation. Review of the MIP images confirms the above findings. Abdomen/pelvis: Hepatobiliary: The liver is normal in density without focal abnormality.The main portal vein is patent. No evidence of calcified gallstones, gallbladder wall thickening or biliary dilatation. Pancreas: Unremarkable. No pancreatic ductal dilatation or surrounding inflammatory changes. Spleen: Normal in size without focal abnormality. Adrenals/Urinary Tract: Both adrenal glands appear normal. The kidneys and collecting system appear normal without evidence of urinary tract calculus or hydronephrosis. Bladder is unremarkable. Stomach/Bowel: The stomach is unremarkable. There is a air-filled duodenal diverticulum seen at the posterior third portion of the duodenum. The remainder of the small bowel is grossly unremarkable. There appears to be mild wall thickening with hyperenhancement seen of the sigmoid colon and rectum with question of mild mesenteric surrounding edema. No definite surrounding free air or loculated fluid collections however are  noted. Vascular/Lymphatic: There are no enlarged mesenteric, retroperitoneal, or pelvic lymph nodes. Scattered aortic atherosclerotic calcifications are seen without aneurysmal dilatation. Reproductive: The uterus and adnexa are unremarkable. Other: No evidence of abdominal wall mass or hernia. Musculoskeletal: Posterior spinal rod fixation from scoliotic surgery seen. There is anterior interbody fusion at L5-S1. There appears to be a significant periprosthetic lucency seen around the left sacroiliac screw measuring up to 8 mm. IMPRESSION: 1. No central, segmental, or subsegmental pulmonary embolism. 2. Mild new reticulonodular/tree-in-bud opacities at the posterior peripheral right lung base in comparison to the prior exam which could be due to an infectious or inflammatory process. 3.  Emphysema (ICD10-J43.9). 4. 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 No surrounding free air or loculated fluid collections. No evidence of obstruction 5. 8 mm periprosthetic lucency around the left sacroiliac screw which could be due to loosening. 6.  Aortic Atherosclerosis (ICD10-I70.0). Electronically Signed   By: Jonna Clark M.D.   On: 02/09/2020 23:11   CT ABDOMEN PELVIS W CONTRAST  Result Date: 02/09/2020 CLINICAL DATA:  Shortness of breath and  abdominal pain EXAM: CT ANGIOGRAPHY CHEST WITH CONTRAST TECHNIQUE: Multidetector CT imaging of the chest was performed using the standard protocol during bolus administration of intravenous contrast. Multiplanar CT image reconstructions and MIPs were obtained to evaluate the vascular anatomy. CONTRAST:  173mL OMNIPAQUE IOHEXOL 350 MG/ML SOLN COMPARISON:  January 09, 2020 FINDINGS: Cardiovascular: There is a optimal opacification of the pulmonary arteries. There is no central,segmental, or subsegmental filling defects within the pulmonary arteries. The heart is normal in size. No pericardial effusion  or thickening. No evidence right heart strain. There is normal three-vessel brachiocephalic anatomy without proximal stenosis. The thoracic aorta is normal in appearance. Mediastinum/Nodes: No hilar, mediastinal, or axillary adenopathy. Thyroid gland, trachea, and esophagus demonstrate no significant findings. Lungs/Pleura: Centrilobular emphysematous changes are seen throughout both lungs. There interval slight worsening of the reticulonodular small tree-in-bud opacities at the posterior right peripheral lung base. There is slight interval improvement however in the right middle lobe tree-in-bud opacities. The left lung is otherwise clear. No pleural effusion. Upper Abdomen: No acute abnormalities present in the visualized portions of the upper abdomen. Musculoskeletal: No chest wall abnormality. No acute or significant osseous findings. Again noted is a scoliotic curvature with posterior spinal rod fixation. Review of the MIP images confirms the above findings. Abdomen/pelvis: Hepatobiliary: The liver is normal in density without focal abnormality.The main portal vein is patent. No evidence of calcified gallstones, gallbladder wall thickening or biliary dilatation. Pancreas: Unremarkable. No pancreatic ductal dilatation or surrounding inflammatory changes. Spleen: Normal in size without focal abnormality. Adrenals/Urinary Tract: Both adrenal glands appear normal. The kidneys and collecting system appear normal without evidence of urinary tract calculus or hydronephrosis. Bladder is unremarkable. Stomach/Bowel: The stomach is unremarkable. There is a air-filled duodenal diverticulum seen at the posterior third portion of the duodenum. The remainder of the small bowel is grossly unremarkable. There appears to be mild wall thickening with hyperenhancement seen of the sigmoid colon and rectum with question of mild mesenteric surrounding edema. No definite surrounding free air or loculated fluid collections however are  noted. Vascular/Lymphatic: There are no enlarged mesenteric, retroperitoneal, or pelvic lymph nodes. Scattered aortic atherosclerotic calcifications are seen without aneurysmal dilatation. Reproductive: The uterus and adnexa are unremarkable. Other: No evidence of abdominal wall mass or hernia. Musculoskeletal: Posterior spinal rod fixation from scoliotic surgery seen. There is anterior interbody fusion at L5-S1. There appears to be a significant periprosthetic lucency seen around the left sacroiliac screw measuring up to 8 mm. IMPRESSION: 1. No central, segmental, or subsegmental pulmonary embolism. 2. Mild new reticulonodular/tree-in-bud opacities at the posterior peripheral right lung base in comparison to the prior exam which could be due to an infectious or inflammatory process. 3.  Emphysema (ICD10-J43.9). 4. 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 No surrounding free air or loculated fluid collections. No evidence of obstruction 5. 8 mm periprosthetic lucency around the left sacroiliac screw which could be due to loosening. 6.  Aortic Atherosclerosis (ICD10-I70.0). Electronically Signed   By: Prudencio Pair M.D.   On: 02/09/2020 23:11   DG Chest Portable 1 View  Result Date: 02/09/2020 CLINICAL DATA:  Shortness of breath EXAM: PORTABLE CHEST 1 VIEW COMPARISON:  01/24/2019 FINDINGS: Cardiac shadow is within normal limits. Orthopedic hardware is again noted and stable. Some fractures of fixation rods are seen but chronic. No infiltrate or sizable effusion is seen. No acute bony abnormality is noted. Stable scoliosis is noted. IMPRESSION: None no acute abnormality  noted. Chronic changes as described above. Electronically Signed   By: Alcide CleverMark  Lukens M.D.   On: 02/09/2020 19:14   DG Swallowing Func-Speech Pathology  Result Date: 02/11/2020 Objective Swallowing Evaluation: Type of Study: MBS-Modified Barium Swallow Study   Patient Details Name: Merceda ElksSherry C Maggi MRN: [id number] Date of Birth: [birth year] Today's Date: 02/11/2020 Time: SLP Start Time (ACUTE ONLY): 1215 -SLP Stop Time (ACUTE ONLY): 1232 SLP Time Calculation (min) (ACUTE ONLY): 17 min Past Medical History: Past Medical History: Diagnosis Date . Anxiety  . Depression  . Pulmonary emphysema (HCC)  Past Surgical History: Past Surgical History: Procedure Laterality Date . BACK SURGERY   HPI: Merceda ElksSherry C Dineen is a 59 y.o. female with medical history significant of emphysema, anxiety depression, chronic back pain issues, scoliosis s/p thoracolumbar fusion surgery multiple times with recent interim history of admission [facility]  4/29-5/3 for sepsis secondary to CAP with associated COPD exacerbation. Patient was discharged on oral antibiotics x7 days, and prednisone taper and was restarted on her prior home medications. Patient now presents to ED with one day of increased cough and shob with increase somnolence and fatigue. Due to these symptoms patient was brought into ed for evaluation. Patient noted no fever, chest pain, focal weakness, but noted one episode of loose stool and mild lower abdominal pain.  Chest xray was showing no acute abnormality.  CT angio of the chest was showing the following:  no central, segmental, or subsegmental pulmonary embolism.  2. Mild new reticulonodular/tree-in-bud opacities at the posterior peripheral right lung base in comparison to the prior exam which could be due to an infectious or inflammatory process.  Subjective: Pt was pleasant and cooperative Assessment / Plan / Recommendation CHL IP CLINICAL IMPRESSIONS 02/11/2020 Clinical Impression Pt was seen for a modified barium swallow study and she presents with overall functional oropharyngeal swallowing abilities.  She consumed trials of thin liquid, puree, and regular solids during this examination.  One episode of trace laryngeal penetration above the vocal cords was  observed between trials when fluoro was not on; otherwise, no laryngeal penetration or aspiration was observed with any trials despite challenging.  Pt was able to clear trace laryngeal penetration with a cued throat clear.  Oral phase was remarkable for mildly prolonged mastication secondary to dentition, piecemeal deglutition, and minimally reduced lingual strength resulting in intermittent trace oral residue.  Pharyngeal phase was remarkable for reduced BOT retraction and reduced hyolaryngeal excursion resulting in vallecular residue.  Pharyngoesophageal phase was remarkable for suspected osteophytes at the level of C5-C6, suspected prolonged esophageal clearance of the barium tablet during esophageal sweep, and trace residue below the UES across trials.  Radiologist was not present to confirm.  GI consult may be beneficial to evaluate further.  Recommend continuation of regular solids and thin liquids with medications administered whole with thin liquid and adherence to compensatory strategies.   SLP Visit Diagnosis Dysphagia, oropharyngeal phase (R13.12);Dysphagia, pharyngoesophageal phase (R13.14) Attention and concentration deficit following -- Frontal lobe and executive function deficit following -- Impact on safety and function Mild aspiration risk   CHL IP TREATMENT RECOMMENDATION 02/11/2020 Treatment Recommendations No treatment recommended at this time   Prognosis 02/11/2020 Prognosis for Safe Diet Advancement Good Barriers to Reach Goals -- Barriers/Prognosis Comment -- CHL IP DIET RECOMMENDATION 02/11/2020 SLP Diet Recommendations Regular solids;Thin liquid Liquid Administration via Cup;Straw Medication Administration Whole meds with liquid Compensations Slow rate;Small sips/bites Postural Changes Remain semi-upright after after feeds/meals (Comment);Seated upright at 90 degrees  CHL IP OTHER RECOMMENDATIONS 02/11/2020 Recommended Consults Consider GI evaluation Oral Care Recommendations Oral care BID  Other Recommendations --   CHL IP FOLLOW UP RECOMMENDATIONS 02/11/2020 Follow up Recommendations None   No flowsheet data found.     CHL IP ORAL PHASE 02/11/2020 Oral Phase Impaired Oral - Pudding Teaspoon -- Oral - Pudding Cup -- Oral - Honey Teaspoon -- Oral - Honey Cup -- Oral - Nectar Teaspoon -- Oral - Nectar Cup -- Oral - Nectar Straw -- Oral - Thin Teaspoon -- Oral - Thin Cup WFL Oral - Thin Straw Premature spillage;Piecemeal swallowing;Lingual/palatal residue Oral - Puree Piecemeal swallowing Oral - Mech Soft -- Oral - Regular Piecemeal swallowing;Lingual/palatal residue Oral - Multi-Consistency -- Oral - Pill Piecemeal swallowing Oral Phase - Comment --  CHL IP PHARYNGEAL PHASE 02/11/2020 Pharyngeal Phase Impaired Pharyngeal- Pudding Teaspoon -- Pharyngeal -- Pharyngeal- Pudding Cup -- Pharyngeal -- Pharyngeal- Honey Teaspoon -- Pharyngeal -- Pharyngeal- Honey Cup -- Pharyngeal -- Pharyngeal- Nectar Teaspoon -- Pharyngeal -- Pharyngeal- Nectar Cup -- Pharyngeal -- Pharyngeal- Nectar Straw -- Pharyngeal -- Pharyngeal- Thin Teaspoon -- Pharyngeal -- Pharyngeal- Thin Cup Delayed swallow initiation-vallecula;Pharyngeal residue - valleculae;Pharyngeal residue - cp segment;Reduced anterior laryngeal mobility;Reduced tongue base retraction Pharyngeal Material does not enter airway Pharyngeal- Thin Straw Delayed swallow initiation-vallecula;Reduced tongue base retraction;Reduced anterior laryngeal mobility;Pharyngeal residue - valleculae;Pharyngeal residue - posterior pharnyx;Pharyngeal residue - cp segment Pharyngeal Material does not enter airway Pharyngeal- Puree Delayed swallow initiation-vallecula;Pharyngeal residue - valleculae;Pharyngeal residue - posterior pharnyx;Pharyngeal residue - cp segment;Reduced anterior laryngeal mobility;Reduced tongue base retraction Pharyngeal Material does not enter airway Pharyngeal- Mechanical Soft -- Pharyngeal -- Pharyngeal- Regular Delayed swallow  initiation-vallecula;Pharyngeal residue - valleculae;Pharyngeal residue - cp segment;Reduced anterior laryngeal mobility;Reduced tongue base retraction Pharyngeal Material does not enter airway Pharyngeal- Multi-consistency -- Pharyngeal -- Pharyngeal- Pill -- Pharyngeal -- Pharyngeal Comment --  CHL IP CERVICAL ESOPHAGEAL PHASE 02/11/2020 Cervical Esophageal Phase Impaired Pudding Teaspoon -- Pudding Cup -- Honey Teaspoon -- Honey Cup -- Nectar Teaspoon -- Nectar Cup -- Nectar Straw -- Thin Teaspoon -- Thin Cup -- Thin Straw -- Puree -- Mechanical Soft -- Regular -- Multi-consistency -- Pill Other (Comment) Cervical Esophageal Comment -- Villa Herb M.S., CCC-SLP Acute Rehabilitation Services Office: [phone] Shanon Rosser [facility] 02/11/2020, 1:29 PM                 The results of significant diagnostics from this hospitalization (including imaging, microbiology, ancillary and laboratory) are listed below for reference.     Microbiology: Recent Results (from the past 240 hour(s))  SARS Coronavirus 2 by RT PCR (hospital order, performed in [facility] hospital lab) Nasopharyngeal Nasopharyngeal Swab     Status: None   Collection Time: 02/09/20  7:23 PM   Specimen: Nasopharyngeal Swab  Result Value Ref Range Status   SARS Coronavirus 2 NEGATIVE NEGATIVE Final    Comment: (NOTE) SARS-CoV-2 target nucleic acids are NOT DETECTED. The SARS-CoV-2 RNA is generally detectable in upper and lower respiratory specimens during the acute phase of infection. The lowest concentration of SARS-CoV-2 viral copies this assay can detect is 250 copies / mL. A negative result does not preclude SARS-CoV-2 infection and should not be used as the sole basis for treatment or other patient management decisions.  A negative result may occur with improper specimen collection / handling, submission of specimen other than nasopharyngeal swab, presence of viral mutation(s) within the areas targeted by this assay, and inadequate  number of viral copies (<250 copies / mL). A negative  result must be combined with clinical observations, patient history, and epidemiological information. Fact Sheet for Patients:   BoilerBrush.com.cy Fact Sheet for Healthcare Providers: https://pope.com/ This test is not yet approved or cleared  by the [place] FDA and has been authorized for detection and/or diagnosis of SARS-CoV-2 by FDA under an Emergency Use Authorization (EUA).  This EUA will remain in effect (meaning this test can be used) for the duration of the COVID-19 declaration under Section 564(b)(1) of the Act, 21 U.S.C. section 360bbb-3(b)(1), unless the authorization is terminated or revoked sooner. Performed at [facility], [address] [place], [place] [address]   Gastrointestinal Panel by PCR , Stool     Status: None   Collection Time: 02/10/20  3:53 AM   Specimen: Stool  Result Value Ref Range Status   Campylobacter species NOT DETECTED NOT DETECTED Final   Plesimonas shigelloides NOT DETECTED NOT DETECTED Final   Salmonella species NOT DETECTED NOT DETECTED Final   Yersinia enterocolitica NOT DETECTED NOT DETECTED Final   Vibrio species NOT DETECTED NOT DETECTED Final   Vibrio cholerae NOT DETECTED NOT DETECTED Final   Enteroaggregative E coli (EAEC) NOT DETECTED NOT DETECTED Final   Enteropathogenic E coli (EPEC) NOT DETECTED NOT DETECTED Final   Enterotoxigenic E coli (ETEC) NOT DETECTED NOT DETECTED Final   Shiga like toxin producing E coli (STEC) NOT DETECTED NOT DETECTED Final   Shigella/Enteroinvasive E coli (EIEC) NOT DETECTED NOT DETECTED Final   Cryptosporidium NOT DETECTED NOT DETECTED Final   Cyclospora cayetanensis NOT DETECTED NOT DETECTED Final   Entamoeba histolytica NOT DETECTED NOT DETECTED Final   Giardia lamblia NOT DETECTED NOT DETECTED Final   Adenovirus F40/41 NOT DETECTED NOT DETECTED Final   Astrovirus NOT DETECTED  NOT DETECTED Final   Norovirus GI/GII NOT DETECTED NOT DETECTED Final   Rotavirus A NOT DETECTED NOT DETECTED Final   Sapovirus (I, II, IV, and V) NOT DETECTED NOT DETECTED Final    Comment: Performed at [facility], [address] [place], [place] [address]  Expectorated sputum assessment w rflx to resp cult     Status: None   Collection Time: 02/10/20  3:54 AM   Specimen: Sputum  Result Value Ref Range Status   Specimen Description SPUTUM  Final   Special Requests NONE  Final   Sputum evaluation   Final    Sputum specimen not acceptable for testing.  Please recollect.   SPOKE WITH BARBARA ON 3E AND TOLD HER WE NEEDED A RECOLLECT 1132 02/10/20 JM Performed at [facility], [address], [place], [place] [address]    Report Status 02/10/2020 FINAL  Final  Gram stain     Status: None   Collection Time: 02/10/20  3:54 AM   Specimen: SPU  Result Value Ref Range Status   Specimen Description   Final    SPUTUM Performed at [facility], [address], [place], [place] [address]    Special Requests   Final    NONE Performed at [facility], [address], [place], [place] [address]    Gram Stain   Final    RARE WBC PRESENT,BOTH PMN AND MONONUCLEAR RARE GRAM POSITIVE RODS Performed at [facility], [address], [place], [place] [address]    Report Status 02/10/2020 FINAL  Final  C Difficile Quick Screen w PCR reflex     Status: Abnormal   Collection Time: 02/11/20  7:46 AM  Specimen: STOOL  Result Value Ref Range Status   C Diff antigen POSITIVE (A) NEGATIVE Final   C Diff toxin NEGATIVE NEGATIVE Final   C Diff interpretation Results are indeterminate. See PCR results.  Final    Comment: Performed at [facility], [address], [place], [place] [address]  MRSA PCR Screening     Status: None   Collection Time: 02/11/20  8:40 AM   Specimen: Nasopharyngeal  Result Value Ref Range  Status   MRSA by PCR NEGATIVE NEGATIVE Final    Comment:        The GeneXpert MRSA Assay (FDA approved for NASAL specimens only), is one component of a comprehensive MRSA colonization surveillance program. It is not intended to diagnose MRSA infection nor to guide or monitor treatment for MRSA infections. Performed at [facility], [address], [place], [place] [address]   C. Diff by PCR, Reflexed     Status: Abnormal   Collection Time: 02/11/20  6:49 PM  Result Value Ref Range Status   Toxigenic C. Difficile by PCR POSITIVE (A) NEGATIVE Final    Comment: Positive for toxigenic C. difficile with little to no toxin production. Only treat if clinical presentation suggests symptomatic illness. Performed at [facility], [address], [place], [place] [address]      Labs: BNP (last 3 results) Recent Labs    02/10/20 0522  BNP 65.3   Basic Metabolic Panel: Recent Labs  Lab 02/09/20 1819 02/09/20 1952 02/11/20 0413 02/12/20 0431  NA 137 137 140 141  K 3.5 4.5 3.2* 3.4*  CL 98  --  107 108  CO2 27  --  24 25  GLUCOSE 118*  --  149* 99  BUN 19  --  8 12  CREATININE 0.98  --  0.49 0.53  CALCIUM 9.0  --  8.3* 8.3*  MG  --   --  1.8 1.9   Liver Function Tests: Recent Labs  Lab 02/09/20 1819  AST 25  ALT 13  ALKPHOS 81  BILITOT 0.9  PROT 8.2*  ALBUMIN 3.5   No results for input(s): LIPASE, AMYLASE in the last 168 hours. Recent Labs  Lab 02/09/20 2253  AMMONIA 22   CBC: Recent Labs  Lab 02/09/20 1819 02/09/20 1952 02/11/20 0413 02/12/20 0431  WBC 9.0  --  11.4* 13.8*  HGB 15.5* 16.7* 11.0* 10.7*  HCT 46.1* 49.0* 34.3* 33.0*  MCV 86.5  --  89.3 89.2  PLT 390  --  298 284   Cardiac Enzymes: No results for input(s): CKTOTAL, CKMB, CKMBINDEX, TROPONINI in the last 168 hours. BNP: Invalid input(s): POCBNP CBG: Recent Labs  Lab 02/09/20 2317  GLUCAP 101*   D-Dimer No results for input(s): DDIMER in the last 72  hours. Hgb A1c No results for input(s): HGBA1C in the last 72 hours. Lipid Profile No results for input(s): CHOL, HDL, LDLCALC, TRIG, CHOLHDL, LDLDIRECT in the last 72 hours. Thyroid function studies No results for input(s): TSH, T4TOTAL, T3FREE, THYROIDAB in the last 72 hours.  Invalid input(s): FREET3 Anemia work up No results for input(s): VITAMINB12, FOLATE, FERRITIN, TIBC, IRON, RETICCTPCT in the last 72 hours. Urinalysis    Component Value Date/Time   COLORURINE YELLOW 02/09/2020 1837   APPEARANCEUR CLEAR 02/09/2020 1837   LABSPEC 1.020 02/09/2020 1837   PHURINE 5.5 02/09/2020 1837   GLUCOSEU NEGATIVE 02/09/2020 1837   HGBUR NEGATIVE 02/09/2020 1837   BILIRUBINUR NEGATIVE 02/09/2020 1837   KETONESUR  NEGATIVE 02/09/2020 1837   PROTEINUR NEGATIVE 02/09/2020 1837   NITRITE NEGATIVE 02/09/2020 1837   LEUKOCYTESUR TRACE (A) 02/09/2020 1837   Sepsis Labs Invalid input(s): PROCALCITONIN,  WBC,  LACTICIDVEN Microbiology Recent Results (from the past 240 hour(s))  SARS Coronavirus 2 by RT PCR (hospital order, performed in [facility] hospital lab) Nasopharyngeal Nasopharyngeal Swab     Status: None   Collection Time: 02/09/20  7:23 PM   Specimen: Nasopharyngeal Swab  Result Value Ref Range Status   SARS Coronavirus 2 NEGATIVE NEGATIVE Final    Comment: (NOTE) SARS-CoV-2 target nucleic acids are NOT DETECTED. The SARS-CoV-2 RNA is generally detectable in upper and lower respiratory specimens during the acute phase of infection. The lowest concentration of SARS-CoV-2 viral copies this assay can detect is 250 copies / mL. A negative result does not preclude SARS-CoV-2 infection and should not be used as the sole basis for treatment or other patient management decisions.  A negative result may occur with improper specimen collection / handling, submission of specimen other than nasopharyngeal swab, presence of viral mutation(s) within the areas targeted by this assay, and  inadequate number of viral copies (<250 copies / mL). A negative result must be combined with clinical observations, patient history, and epidemiological information. Fact Sheet for Patients:   BoilerBrush.com.cy Fact Sheet for Healthcare Providers: https://pope.com/ This test is not yet approved or cleared  by the [place] FDA and has been authorized for detection and/or diagnosis of SARS-CoV-2 by FDA under an Emergency Use Authorization (EUA).  This EUA will remain in effect (meaning this test can be used) for the duration of the COVID-19 declaration under Section 564(b)(1) of the Act, 21 U.S.C. section 360bbb-3(b)(1), unless the authorization is terminated or revoked sooner. Performed at [facility], [address] [place], [place] [address]   Gastrointestinal Panel by PCR , Stool     Status: None   Collection Time: 02/10/20  3:53 AM   Specimen: Stool  Result Value Ref Range Status   Campylobacter species NOT DETECTED NOT DETECTED Final   Plesimonas shigelloides NOT DETECTED NOT DETECTED Final   Salmonella species NOT DETECTED NOT DETECTED Final   Yersinia enterocolitica NOT DETECTED NOT DETECTED Final   Vibrio species NOT DETECTED NOT DETECTED Final   Vibrio cholerae NOT DETECTED NOT DETECTED Final   Enteroaggregative E coli (EAEC) NOT DETECTED NOT DETECTED Final   Enteropathogenic E coli (EPEC) NOT DETECTED NOT DETECTED Final   Enterotoxigenic E coli (ETEC) NOT DETECTED NOT DETECTED Final   Shiga like toxin producing E coli (STEC) NOT DETECTED NOT DETECTED Final   Shigella/Enteroinvasive E coli (EIEC) NOT DETECTED NOT DETECTED Final   Cryptosporidium NOT DETECTED NOT DETECTED Final   Cyclospora cayetanensis NOT DETECTED NOT DETECTED Final   Entamoeba histolytica NOT DETECTED NOT DETECTED Final   Giardia lamblia NOT DETECTED NOT DETECTED Final   Adenovirus F40/41 NOT DETECTED NOT DETECTED Final   Astrovirus NOT  DETECTED NOT DETECTED Final   Norovirus GI/GII NOT DETECTED NOT DETECTED Final   Rotavirus A NOT DETECTED NOT DETECTED Final   Sapovirus (I, II, IV, and V) NOT DETECTED NOT DETECTED Final    Comment: Performed at [facility], [address] [place], [place] [address]  Expectorated sputum assessment w rflx to resp cult     Status: None   Collection Time: 02/10/20  3:54 AM   Specimen: Sputum  Result Value Ref Range Status   Specimen Description SPUTUM  Final  Special Requests NONE  Final   Sputum evaluation   Final    Sputum specimen not acceptable for testing.  Please recollect.   SPOKE WITH BARBARA ON 3E AND TOLD HER WE NEEDED A RECOLLECT 1132 02/10/20 JM Performed at [facility], [address], [place], [place] [address]    Report Status 02/10/2020 FINAL  Final  Gram stain     Status: None   Collection Time: 02/10/20  3:54 AM   Specimen: SPU  Result Value Ref Range Status   Specimen Description   Final    SPUTUM Performed at [facility], [address] [place], [place] [address]    Special Requests   Final    NONE Performed at [facility], [address] [place], [place] [address]    Gram Stain   Final    RARE WBC PRESENT,BOTH PMN AND MONONUCLEAR RARE GRAM POSITIVE RODS Performed at [facility], [address] [place], [place] [address]    Report Status 02/10/2020 FINAL  Final  C Difficile Quick Screen w PCR reflex     Status: Abnormal   Collection Time: 02/11/20  7:46 AM   Specimen: STOOL  Result Value Ref Range Status   C Diff antigen POSITIVE (A) NEGATIVE Final   C Diff toxin NEGATIVE NEGATIVE Final   C Diff interpretation Results are indeterminate. See PCR results.  Final    Comment: Performed at [facility], [address], [place], [place] [address]  MRSA PCR Screening     Status: None   Collection Time: 02/11/20  8:40 AM   Specimen: Nasopharyngeal  Result Value  Ref Range Status   MRSA by PCR NEGATIVE NEGATIVE Final    Comment:        The GeneXpert MRSA Assay (FDA approved for NASAL specimens only), is one component of a comprehensive MRSA colonization surveillance program. It is not intended to diagnose MRSA infection nor to guide or monitor treatment for MRSA infections. Performed at [facility], [address], [place], [place] [address]   C. Diff by PCR, Reflexed     Status: Abnormal   Collection Time: 02/11/20  6:49 PM  Result Value Ref Range Status   Toxigenic C. Difficile by PCR POSITIVE (A) NEGATIVE Final    Comment: Positive for toxigenic C. difficile with little to no toxin production. Only treat if clinical presentation suggests symptomatic illness. Performed at [facility], [address] [place], [place] [address]      Time coordinating discharge:  I have spent 35 minutes face to face with the patient and on the ward discussing the patients care, assessment, plan and disposition with other care givers. >50% of the time was devoted counseling the patient about the risks and benefits of treatment/Discharge disposition and coordinating care.   SIGNED:   Dimple Nanas, MD  [facility] 02/12/2020, 8:57 AM   If 7PM-7AM, please contact night-coverage

## 2020-02-15 DIAGNOSIS — M961 Postlaminectomy syndrome, not elsewhere classified: Secondary | ICD-10-CM | POA: Diagnosis not present

## 2020-02-15 DIAGNOSIS — Z09 Encounter for follow-up examination after completed treatment for conditions other than malignant neoplasm: Secondary | ICD-10-CM | POA: Diagnosis not present

## 2020-02-15 DIAGNOSIS — Z79899 Other long term (current) drug therapy: Secondary | ICD-10-CM | POA: Diagnosis not present

## 2020-02-15 DIAGNOSIS — M5416 Radiculopathy, lumbar region: Secondary | ICD-10-CM | POA: Diagnosis not present

## 2020-02-15 DIAGNOSIS — M545 Low back pain: Secondary | ICD-10-CM | POA: Diagnosis not present

## 2020-02-15 DIAGNOSIS — M5136 Other intervertebral disc degeneration, lumbar region: Secondary | ICD-10-CM | POA: Diagnosis not present

## 2020-03-14 DIAGNOSIS — Z79899 Other long term (current) drug therapy: Secondary | ICD-10-CM | POA: Diagnosis not present

## 2020-03-14 DIAGNOSIS — M545 Low back pain: Secondary | ICD-10-CM | POA: Diagnosis not present

## 2020-03-14 DIAGNOSIS — M5136 Other intervertebral disc degeneration, lumbar region: Secondary | ICD-10-CM | POA: Diagnosis not present

## 2020-03-14 DIAGNOSIS — M5416 Radiculopathy, lumbar region: Secondary | ICD-10-CM | POA: Diagnosis not present

## 2020-03-14 DIAGNOSIS — M961 Postlaminectomy syndrome, not elsewhere classified: Secondary | ICD-10-CM | POA: Diagnosis not present

## 2020-04-11 DIAGNOSIS — M5136 Other intervertebral disc degeneration, lumbar region: Secondary | ICD-10-CM | POA: Diagnosis not present

## 2020-04-11 DIAGNOSIS — M545 Low back pain: Secondary | ICD-10-CM | POA: Diagnosis not present

## 2020-04-11 DIAGNOSIS — M961 Postlaminectomy syndrome, not elsewhere classified: Secondary | ICD-10-CM | POA: Diagnosis not present

## 2020-04-11 DIAGNOSIS — R300 Dysuria: Secondary | ICD-10-CM | POA: Diagnosis not present

## 2020-04-11 DIAGNOSIS — Z79899 Other long term (current) drug therapy: Secondary | ICD-10-CM | POA: Diagnosis not present

## 2020-04-11 DIAGNOSIS — D539 Nutritional anemia, unspecified: Secondary | ICD-10-CM | POA: Diagnosis not present

## 2020-04-24 DIAGNOSIS — E559 Vitamin D deficiency, unspecified: Secondary | ICD-10-CM | POA: Diagnosis not present

## 2020-04-24 DIAGNOSIS — M545 Low back pain: Secondary | ICD-10-CM | POA: Diagnosis not present

## 2020-04-24 DIAGNOSIS — Z79899 Other long term (current) drug therapy: Secondary | ICD-10-CM | POA: Diagnosis not present

## 2020-04-24 DIAGNOSIS — E7800 Pure hypercholesterolemia, unspecified: Secondary | ICD-10-CM | POA: Diagnosis not present

## 2020-04-24 DIAGNOSIS — M129 Arthropathy, unspecified: Secondary | ICD-10-CM | POA: Diagnosis not present

## 2020-04-24 DIAGNOSIS — D539 Nutritional anemia, unspecified: Secondary | ICD-10-CM | POA: Diagnosis not present

## 2020-04-24 DIAGNOSIS — Z20822 Contact with and (suspected) exposure to covid-19: Secondary | ICD-10-CM | POA: Diagnosis not present

## 2020-04-24 DIAGNOSIS — M5136 Other intervertebral disc degeneration, lumbar region: Secondary | ICD-10-CM | POA: Diagnosis not present

## 2020-04-24 DIAGNOSIS — M961 Postlaminectomy syndrome, not elsewhere classified: Secondary | ICD-10-CM | POA: Diagnosis not present

## 2020-04-29 DIAGNOSIS — F330 Major depressive disorder, recurrent, mild: Secondary | ICD-10-CM | POA: Diagnosis not present

## 2020-04-29 DIAGNOSIS — F411 Generalized anxiety disorder: Secondary | ICD-10-CM | POA: Diagnosis not present

## 2020-04-29 DIAGNOSIS — F429 Obsessive-compulsive disorder, unspecified: Secondary | ICD-10-CM | POA: Diagnosis not present

## 2020-06-06 DIAGNOSIS — M5136 Other intervertebral disc degeneration, lumbar region: Secondary | ICD-10-CM | POA: Diagnosis not present

## 2020-06-06 DIAGNOSIS — M5416 Radiculopathy, lumbar region: Secondary | ICD-10-CM | POA: Diagnosis not present

## 2020-06-06 DIAGNOSIS — M961 Postlaminectomy syndrome, not elsewhere classified: Secondary | ICD-10-CM | POA: Diagnosis not present

## 2020-06-06 DIAGNOSIS — M545 Low back pain: Secondary | ICD-10-CM | POA: Diagnosis not present

## 2020-06-06 DIAGNOSIS — Z79899 Other long term (current) drug therapy: Secondary | ICD-10-CM | POA: Diagnosis not present

## 2020-06-10 DIAGNOSIS — F429 Obsessive-compulsive disorder, unspecified: Secondary | ICD-10-CM | POA: Diagnosis not present

## 2020-06-10 DIAGNOSIS — F411 Generalized anxiety disorder: Secondary | ICD-10-CM | POA: Diagnosis not present

## 2020-06-10 DIAGNOSIS — F330 Major depressive disorder, recurrent, mild: Secondary | ICD-10-CM | POA: Diagnosis not present

## 2020-07-04 DIAGNOSIS — M546 Pain in thoracic spine: Secondary | ICD-10-CM | POA: Diagnosis not present

## 2020-07-04 DIAGNOSIS — Z79899 Other long term (current) drug therapy: Secondary | ICD-10-CM | POA: Diagnosis not present

## 2020-07-04 DIAGNOSIS — M7918 Myalgia, other site: Secondary | ICD-10-CM | POA: Diagnosis not present

## 2020-07-04 DIAGNOSIS — G8929 Other chronic pain: Secondary | ICD-10-CM | POA: Diagnosis not present

## 2020-07-04 DIAGNOSIS — M545 Low back pain, unspecified: Secondary | ICD-10-CM | POA: Diagnosis not present

## 2020-08-01 DIAGNOSIS — M546 Pain in thoracic spine: Secondary | ICD-10-CM | POA: Diagnosis not present

## 2020-08-01 DIAGNOSIS — M40209 Unspecified kyphosis, site unspecified: Secondary | ICD-10-CM | POA: Diagnosis not present

## 2020-08-01 DIAGNOSIS — M5136 Other intervertebral disc degeneration, lumbar region: Secondary | ICD-10-CM | POA: Diagnosis not present

## 2020-08-01 DIAGNOSIS — Z79899 Other long term (current) drug therapy: Secondary | ICD-10-CM | POA: Diagnosis not present

## 2020-08-01 DIAGNOSIS — M545 Low back pain, unspecified: Secondary | ICD-10-CM | POA: Diagnosis not present

## 2020-08-29 DIAGNOSIS — M62838 Other muscle spasm: Secondary | ICD-10-CM | POA: Diagnosis not present

## 2020-08-29 DIAGNOSIS — M546 Pain in thoracic spine: Secondary | ICD-10-CM | POA: Diagnosis not present

## 2020-08-29 DIAGNOSIS — Z79899 Other long term (current) drug therapy: Secondary | ICD-10-CM | POA: Diagnosis not present

## 2020-08-29 DIAGNOSIS — M40209 Unspecified kyphosis, site unspecified: Secondary | ICD-10-CM | POA: Diagnosis not present

## 2020-08-29 DIAGNOSIS — M5136 Other intervertebral disc degeneration, lumbar region: Secondary | ICD-10-CM | POA: Diagnosis not present

## 2020-08-29 DIAGNOSIS — Z23 Encounter for immunization: Secondary | ICD-10-CM | POA: Diagnosis not present

## 2020-09-05 DIAGNOSIS — F411 Generalized anxiety disorder: Secondary | ICD-10-CM | POA: Diagnosis not present

## 2020-09-05 DIAGNOSIS — F330 Major depressive disorder, recurrent, mild: Secondary | ICD-10-CM | POA: Diagnosis not present

## 2020-09-05 DIAGNOSIS — F429 Obsessive-compulsive disorder, unspecified: Secondary | ICD-10-CM | POA: Diagnosis not present

## 2020-09-10 DIAGNOSIS — Z79899 Other long term (current) drug therapy: Secondary | ICD-10-CM | POA: Diagnosis not present

## 2020-09-10 DIAGNOSIS — M545 Low back pain, unspecified: Secondary | ICD-10-CM | POA: Diagnosis not present

## 2020-09-10 DIAGNOSIS — Z87891 Personal history of nicotine dependence: Secondary | ICD-10-CM | POA: Diagnosis not present

## 2020-09-25 DIAGNOSIS — M7918 Myalgia, other site: Secondary | ICD-10-CM | POA: Diagnosis not present

## 2020-09-25 DIAGNOSIS — G8929 Other chronic pain: Secondary | ICD-10-CM | POA: Diagnosis not present

## 2020-10-23 DIAGNOSIS — J449 Chronic obstructive pulmonary disease, unspecified: Secondary | ICD-10-CM | POA: Diagnosis not present

## 2020-10-23 DIAGNOSIS — M5136 Other intervertebral disc degeneration, lumbar region: Secondary | ICD-10-CM | POA: Diagnosis not present

## 2020-10-23 DIAGNOSIS — Z79899 Other long term (current) drug therapy: Secondary | ICD-10-CM | POA: Diagnosis not present

## 2020-10-23 DIAGNOSIS — M419 Scoliosis, unspecified: Secondary | ICD-10-CM | POA: Diagnosis not present

## 2020-10-23 DIAGNOSIS — M961 Postlaminectomy syndrome, not elsewhere classified: Secondary | ICD-10-CM | POA: Diagnosis not present

## 2020-10-23 DIAGNOSIS — M545 Low back pain, unspecified: Secondary | ICD-10-CM | POA: Diagnosis not present

## 2020-11-19 DIAGNOSIS — M48061 Spinal stenosis, lumbar region without neurogenic claudication: Secondary | ICD-10-CM | POA: Diagnosis not present

## 2020-11-19 DIAGNOSIS — M5136 Other intervertebral disc degeneration, lumbar region: Secondary | ICD-10-CM | POA: Diagnosis not present

## 2020-11-19 DIAGNOSIS — M5416 Radiculopathy, lumbar region: Secondary | ICD-10-CM | POA: Diagnosis not present

## 2020-11-19 DIAGNOSIS — Z79899 Other long term (current) drug therapy: Secondary | ICD-10-CM | POA: Diagnosis not present

## 2020-11-19 DIAGNOSIS — M961 Postlaminectomy syndrome, not elsewhere classified: Secondary | ICD-10-CM | POA: Diagnosis not present

## 2020-11-20 DIAGNOSIS — F17210 Nicotine dependence, cigarettes, uncomplicated: Secondary | ICD-10-CM | POA: Diagnosis not present

## 2020-11-20 DIAGNOSIS — R0602 Shortness of breath: Secondary | ICD-10-CM | POA: Diagnosis not present

## 2020-11-20 DIAGNOSIS — R942 Abnormal results of pulmonary function studies: Secondary | ICD-10-CM | POA: Diagnosis not present

## 2020-12-02 DIAGNOSIS — J449 Chronic obstructive pulmonary disease, unspecified: Secondary | ICD-10-CM | POA: Diagnosis not present

## 2020-12-02 DIAGNOSIS — R0602 Shortness of breath: Secondary | ICD-10-CM | POA: Diagnosis not present

## 2020-12-04 DIAGNOSIS — F429 Obsessive-compulsive disorder, unspecified: Secondary | ICD-10-CM | POA: Diagnosis not present

## 2020-12-04 DIAGNOSIS — F411 Generalized anxiety disorder: Secondary | ICD-10-CM | POA: Diagnosis not present

## 2020-12-04 DIAGNOSIS — F331 Major depressive disorder, recurrent, moderate: Secondary | ICD-10-CM | POA: Diagnosis not present

## 2020-12-04 DIAGNOSIS — Z79899 Other long term (current) drug therapy: Secondary | ICD-10-CM | POA: Diagnosis not present

## 2020-12-17 DIAGNOSIS — M5416 Radiculopathy, lumbar region: Secondary | ICD-10-CM | POA: Diagnosis not present

## 2020-12-17 DIAGNOSIS — G8929 Other chronic pain: Secondary | ICD-10-CM | POA: Diagnosis not present

## 2020-12-17 DIAGNOSIS — Z79899 Other long term (current) drug therapy: Secondary | ICD-10-CM | POA: Diagnosis not present

## 2020-12-17 DIAGNOSIS — M549 Dorsalgia, unspecified: Secondary | ICD-10-CM | POA: Diagnosis not present

## 2020-12-17 DIAGNOSIS — M419 Scoliosis, unspecified: Secondary | ICD-10-CM | POA: Diagnosis not present

## 2020-12-23 DIAGNOSIS — Z79899 Other long term (current) drug therapy: Secondary | ICD-10-CM | POA: Diagnosis not present

## 2020-12-23 DIAGNOSIS — M419 Scoliosis, unspecified: Secondary | ICD-10-CM | POA: Diagnosis not present

## 2020-12-23 DIAGNOSIS — F1110 Opioid abuse, uncomplicated: Secondary | ICD-10-CM | POA: Diagnosis not present

## 2020-12-23 DIAGNOSIS — M5136 Other intervertebral disc degeneration, lumbar region: Secondary | ICD-10-CM | POA: Diagnosis not present

## 2020-12-23 DIAGNOSIS — M5416 Radiculopathy, lumbar region: Secondary | ICD-10-CM | POA: Diagnosis not present

## 2021-01-13 DIAGNOSIS — M48061 Spinal stenosis, lumbar region without neurogenic claudication: Secondary | ICD-10-CM | POA: Diagnosis not present

## 2021-01-13 DIAGNOSIS — M419 Scoliosis, unspecified: Secondary | ICD-10-CM | POA: Diagnosis not present

## 2021-01-13 DIAGNOSIS — M5136 Other intervertebral disc degeneration, lumbar region: Secondary | ICD-10-CM | POA: Diagnosis not present

## 2021-01-13 DIAGNOSIS — Z79899 Other long term (current) drug therapy: Secondary | ICD-10-CM | POA: Diagnosis not present

## 2021-01-13 DIAGNOSIS — M961 Postlaminectomy syndrome, not elsewhere classified: Secondary | ICD-10-CM | POA: Diagnosis not present

## 2021-02-09 DIAGNOSIS — M48061 Spinal stenosis, lumbar region without neurogenic claudication: Secondary | ICD-10-CM | POA: Diagnosis not present

## 2021-02-09 DIAGNOSIS — Z79899 Other long term (current) drug therapy: Secondary | ICD-10-CM | POA: Diagnosis not present

## 2021-02-09 DIAGNOSIS — M961 Postlaminectomy syndrome, not elsewhere classified: Secondary | ICD-10-CM | POA: Diagnosis not present

## 2021-02-09 DIAGNOSIS — R030 Elevated blood-pressure reading, without diagnosis of hypertension: Secondary | ICD-10-CM | POA: Diagnosis not present

## 2021-02-09 DIAGNOSIS — M419 Scoliosis, unspecified: Secondary | ICD-10-CM | POA: Diagnosis not present

## 2021-02-09 DIAGNOSIS — Z681 Body mass index (BMI) 19 or less, adult: Secondary | ICD-10-CM | POA: Diagnosis not present

## 2021-02-09 DIAGNOSIS — M5136 Other intervertebral disc degeneration, lumbar region: Secondary | ICD-10-CM | POA: Diagnosis not present

## 2021-02-23 DIAGNOSIS — G8929 Other chronic pain: Secondary | ICD-10-CM | POA: Diagnosis not present

## 2021-02-23 DIAGNOSIS — Z681 Body mass index (BMI) 19 or less, adult: Secondary | ICD-10-CM | POA: Diagnosis not present

## 2021-02-23 DIAGNOSIS — M419 Scoliosis, unspecified: Secondary | ICD-10-CM | POA: Diagnosis not present

## 2021-02-23 DIAGNOSIS — M48061 Spinal stenosis, lumbar region without neurogenic claudication: Secondary | ICD-10-CM | POA: Diagnosis not present

## 2021-02-23 DIAGNOSIS — Z79899 Other long term (current) drug therapy: Secondary | ICD-10-CM | POA: Diagnosis not present

## 2021-02-23 DIAGNOSIS — M549 Dorsalgia, unspecified: Secondary | ICD-10-CM | POA: Diagnosis not present

## 2021-03-04 DIAGNOSIS — F17210 Nicotine dependence, cigarettes, uncomplicated: Secondary | ICD-10-CM | POA: Diagnosis not present

## 2021-03-04 DIAGNOSIS — R0602 Shortness of breath: Secondary | ICD-10-CM | POA: Diagnosis not present

## 2021-03-04 DIAGNOSIS — J449 Chronic obstructive pulmonary disease, unspecified: Secondary | ICD-10-CM | POA: Diagnosis not present

## 2021-03-11 DIAGNOSIS — F411 Generalized anxiety disorder: Secondary | ICD-10-CM | POA: Diagnosis not present

## 2021-03-11 DIAGNOSIS — F330 Major depressive disorder, recurrent, mild: Secondary | ICD-10-CM | POA: Diagnosis not present

## 2021-03-11 DIAGNOSIS — F429 Obsessive-compulsive disorder, unspecified: Secondary | ICD-10-CM | POA: Diagnosis not present

## 2021-04-07 DIAGNOSIS — D539 Nutritional anemia, unspecified: Secondary | ICD-10-CM | POA: Diagnosis not present

## 2021-04-07 DIAGNOSIS — M129 Arthropathy, unspecified: Secondary | ICD-10-CM | POA: Diagnosis not present

## 2021-04-07 DIAGNOSIS — R300 Dysuria: Secondary | ICD-10-CM | POA: Diagnosis not present

## 2021-04-07 DIAGNOSIS — G8929 Other chronic pain: Secondary | ICD-10-CM | POA: Diagnosis not present

## 2021-04-07 DIAGNOSIS — R5383 Other fatigue: Secondary | ICD-10-CM | POA: Diagnosis not present

## 2021-04-07 DIAGNOSIS — M48061 Spinal stenosis, lumbar region without neurogenic claudication: Secondary | ICD-10-CM | POA: Diagnosis not present

## 2021-04-07 DIAGNOSIS — F17210 Nicotine dependence, cigarettes, uncomplicated: Secondary | ICD-10-CM | POA: Diagnosis not present

## 2021-04-07 DIAGNOSIS — E559 Vitamin D deficiency, unspecified: Secondary | ICD-10-CM | POA: Diagnosis not present

## 2021-04-07 DIAGNOSIS — M419 Scoliosis, unspecified: Secondary | ICD-10-CM | POA: Diagnosis not present

## 2021-04-07 DIAGNOSIS — Z79899 Other long term (current) drug therapy: Secondary | ICD-10-CM | POA: Diagnosis not present

## 2021-04-07 DIAGNOSIS — E7800 Pure hypercholesterolemia, unspecified: Secondary | ICD-10-CM | POA: Diagnosis not present

## 2021-04-07 DIAGNOSIS — Z1159 Encounter for screening for other viral diseases: Secondary | ICD-10-CM | POA: Diagnosis not present

## 2021-04-07 DIAGNOSIS — M549 Dorsalgia, unspecified: Secondary | ICD-10-CM | POA: Diagnosis not present

## 2021-04-10 DIAGNOSIS — Z79899 Other long term (current) drug therapy: Secondary | ICD-10-CM | POA: Diagnosis not present

## 2021-04-29 DIAGNOSIS — R059 Cough, unspecified: Secondary | ICD-10-CM | POA: Diagnosis not present

## 2021-04-30 DIAGNOSIS — R0602 Shortness of breath: Secondary | ICD-10-CM | POA: Diagnosis not present

## 2021-04-30 DIAGNOSIS — F17210 Nicotine dependence, cigarettes, uncomplicated: Secondary | ICD-10-CM | POA: Diagnosis not present

## 2021-04-30 DIAGNOSIS — Z87891 Personal history of nicotine dependence: Secondary | ICD-10-CM | POA: Diagnosis not present

## 2021-05-04 DIAGNOSIS — M546 Pain in thoracic spine: Secondary | ICD-10-CM | POA: Diagnosis not present

## 2021-05-04 DIAGNOSIS — M419 Scoliosis, unspecified: Secondary | ICD-10-CM | POA: Diagnosis not present

## 2021-05-04 DIAGNOSIS — M5440 Lumbago with sciatica, unspecified side: Secondary | ICD-10-CM | POA: Diagnosis not present

## 2021-05-04 DIAGNOSIS — M5416 Radiculopathy, lumbar region: Secondary | ICD-10-CM | POA: Diagnosis not present

## 2021-05-04 DIAGNOSIS — Z79899 Other long term (current) drug therapy: Secondary | ICD-10-CM | POA: Diagnosis not present

## 2021-05-06 DIAGNOSIS — Z79899 Other long term (current) drug therapy: Secondary | ICD-10-CM | POA: Diagnosis not present

## 2021-06-02 DIAGNOSIS — M419 Scoliosis, unspecified: Secondary | ICD-10-CM | POA: Diagnosis not present

## 2021-06-02 DIAGNOSIS — Z79899 Other long term (current) drug therapy: Secondary | ICD-10-CM | POA: Diagnosis not present

## 2021-06-02 DIAGNOSIS — M5136 Other intervertebral disc degeneration, lumbar region: Secondary | ICD-10-CM | POA: Diagnosis not present

## 2021-06-02 DIAGNOSIS — M7918 Myalgia, other site: Secondary | ICD-10-CM | POA: Diagnosis not present

## 2021-06-02 DIAGNOSIS — G8929 Other chronic pain: Secondary | ICD-10-CM | POA: Diagnosis not present

## 2021-06-03 DIAGNOSIS — F330 Major depressive disorder, recurrent, mild: Secondary | ICD-10-CM | POA: Diagnosis not present

## 2021-06-03 DIAGNOSIS — F411 Generalized anxiety disorder: Secondary | ICD-10-CM | POA: Diagnosis not present

## 2021-06-03 DIAGNOSIS — F429 Obsessive-compulsive disorder, unspecified: Secondary | ICD-10-CM | POA: Diagnosis not present

## 2021-06-04 DIAGNOSIS — Z79899 Other long term (current) drug therapy: Secondary | ICD-10-CM | POA: Diagnosis not present

## 2021-06-29 DIAGNOSIS — M961 Postlaminectomy syndrome, not elsewhere classified: Secondary | ICD-10-CM | POA: Diagnosis not present

## 2021-06-29 DIAGNOSIS — M5136 Other intervertebral disc degeneration, lumbar region: Secondary | ICD-10-CM | POA: Diagnosis not present

## 2021-06-29 DIAGNOSIS — J449 Chronic obstructive pulmonary disease, unspecified: Secondary | ICD-10-CM | POA: Diagnosis not present

## 2021-06-29 DIAGNOSIS — Z79899 Other long term (current) drug therapy: Secondary | ICD-10-CM | POA: Diagnosis not present

## 2021-06-29 DIAGNOSIS — M419 Scoliosis, unspecified: Secondary | ICD-10-CM | POA: Diagnosis not present

## 2021-07-01 DIAGNOSIS — Z79899 Other long term (current) drug therapy: Secondary | ICD-10-CM | POA: Diagnosis not present

## 2021-07-06 DIAGNOSIS — F17210 Nicotine dependence, cigarettes, uncomplicated: Secondary | ICD-10-CM | POA: Diagnosis not present

## 2021-07-06 DIAGNOSIS — J449 Chronic obstructive pulmonary disease, unspecified: Secondary | ICD-10-CM | POA: Diagnosis not present

## 2021-07-06 DIAGNOSIS — M549 Dorsalgia, unspecified: Secondary | ICD-10-CM | POA: Diagnosis not present

## 2021-07-06 DIAGNOSIS — R0789 Other chest pain: Secondary | ICD-10-CM | POA: Diagnosis not present

## 2021-07-06 DIAGNOSIS — F17200 Nicotine dependence, unspecified, uncomplicated: Secondary | ICD-10-CM | POA: Diagnosis not present

## 2021-07-28 DIAGNOSIS — Z1159 Encounter for screening for other viral diseases: Secondary | ICD-10-CM | POA: Diagnosis not present

## 2021-07-28 DIAGNOSIS — R5383 Other fatigue: Secondary | ICD-10-CM | POA: Diagnosis not present

## 2021-07-28 DIAGNOSIS — E559 Vitamin D deficiency, unspecified: Secondary | ICD-10-CM | POA: Diagnosis not present

## 2021-07-28 DIAGNOSIS — M129 Arthropathy, unspecified: Secondary | ICD-10-CM | POA: Diagnosis not present

## 2021-07-28 DIAGNOSIS — F17210 Nicotine dependence, cigarettes, uncomplicated: Secondary | ICD-10-CM | POA: Diagnosis not present

## 2021-07-28 DIAGNOSIS — Z79899 Other long term (current) drug therapy: Secondary | ICD-10-CM | POA: Diagnosis not present

## 2021-07-28 DIAGNOSIS — D539 Nutritional anemia, unspecified: Secondary | ICD-10-CM | POA: Diagnosis not present

## 2021-07-28 DIAGNOSIS — M5136 Other intervertebral disc degeneration, lumbar region: Secondary | ICD-10-CM | POA: Diagnosis not present

## 2021-07-28 DIAGNOSIS — J449 Chronic obstructive pulmonary disease, unspecified: Secondary | ICD-10-CM | POA: Diagnosis not present

## 2021-07-28 DIAGNOSIS — M5440 Lumbago with sciatica, unspecified side: Secondary | ICD-10-CM | POA: Diagnosis not present

## 2021-07-28 DIAGNOSIS — M961 Postlaminectomy syndrome, not elsewhere classified: Secondary | ICD-10-CM | POA: Diagnosis not present

## 2021-07-28 DIAGNOSIS — R300 Dysuria: Secondary | ICD-10-CM | POA: Diagnosis not present

## 2021-07-28 DIAGNOSIS — E7800 Pure hypercholesterolemia, unspecified: Secondary | ICD-10-CM | POA: Diagnosis not present

## 2021-07-30 DIAGNOSIS — Z79899 Other long term (current) drug therapy: Secondary | ICD-10-CM | POA: Diagnosis not present

## 2021-08-25 DIAGNOSIS — Z79899 Other long term (current) drug therapy: Secondary | ICD-10-CM | POA: Diagnosis not present

## 2021-08-25 DIAGNOSIS — I739 Peripheral vascular disease, unspecified: Secondary | ICD-10-CM | POA: Diagnosis not present

## 2021-08-25 DIAGNOSIS — M5440 Lumbago with sciatica, unspecified side: Secondary | ICD-10-CM | POA: Diagnosis not present

## 2021-08-25 DIAGNOSIS — M5136 Other intervertebral disc degeneration, lumbar region: Secondary | ICD-10-CM | POA: Diagnosis not present

## 2021-08-25 DIAGNOSIS — M419 Scoliosis, unspecified: Secondary | ICD-10-CM | POA: Diagnosis not present

## 2021-08-25 DIAGNOSIS — Z23 Encounter for immunization: Secondary | ICD-10-CM | POA: Diagnosis not present

## 2021-08-25 DIAGNOSIS — Z0000 Encounter for general adult medical examination without abnormal findings: Secondary | ICD-10-CM | POA: Diagnosis not present

## 2021-08-27 DIAGNOSIS — Z79899 Other long term (current) drug therapy: Secondary | ICD-10-CM | POA: Diagnosis not present

## 2021-09-02 DIAGNOSIS — F330 Major depressive disorder, recurrent, mild: Secondary | ICD-10-CM | POA: Diagnosis not present

## 2021-09-02 DIAGNOSIS — F419 Anxiety disorder, unspecified: Secondary | ICD-10-CM | POA: Diagnosis not present

## 2021-09-07 DIAGNOSIS — Z23 Encounter for immunization: Secondary | ICD-10-CM | POA: Diagnosis not present

## 2021-09-07 DIAGNOSIS — Z79899 Other long term (current) drug therapy: Secondary | ICD-10-CM | POA: Diagnosis not present

## 2021-09-07 DIAGNOSIS — M419 Scoliosis, unspecified: Secondary | ICD-10-CM | POA: Diagnosis not present

## 2021-09-07 DIAGNOSIS — M5136 Other intervertebral disc degeneration, lumbar region: Secondary | ICD-10-CM | POA: Diagnosis not present

## 2021-09-07 DIAGNOSIS — M961 Postlaminectomy syndrome, not elsewhere classified: Secondary | ICD-10-CM | POA: Diagnosis not present

## 2021-09-07 DIAGNOSIS — M5440 Lumbago with sciatica, unspecified side: Secondary | ICD-10-CM | POA: Diagnosis not present

## 2021-09-09 DIAGNOSIS — Z79899 Other long term (current) drug therapy: Secondary | ICD-10-CM | POA: Diagnosis not present

## 2021-09-24 ENCOUNTER — Encounter (HOSPITAL_COMMUNITY): Payer: Self-pay

## 2021-09-24 ENCOUNTER — Inpatient Hospital Stay (HOSPITAL_COMMUNITY)
Admission: EM | Admit: 2021-09-24 | Discharge: 2021-09-29 | DRG: 177 | Disposition: A | Discharge status: Home / Self Care | Payer: PPO | Attending: Internal Medicine | Admitting: Internal Medicine

## 2021-09-24 DIAGNOSIS — Z681 Body mass index (BMI) 19 or less, adult: Secondary | ICD-10-CM

## 2021-09-24 DIAGNOSIS — Z716 Tobacco abuse counseling: Secondary | ICD-10-CM

## 2021-09-24 DIAGNOSIS — Z7951 Long term (current) use of inhaled steroids: Secondary | ICD-10-CM

## 2021-09-24 DIAGNOSIS — Z79899 Other long term (current) drug therapy: Secondary | ICD-10-CM

## 2021-09-24 DIAGNOSIS — J441 Chronic obstructive pulmonary disease with (acute) exacerbation: Secondary | ICD-10-CM | POA: Diagnosis present

## 2021-09-24 DIAGNOSIS — F32A Depression, unspecified: Secondary | ICD-10-CM | POA: Diagnosis present

## 2021-09-24 DIAGNOSIS — Z8619 Personal history of other infectious and parasitic diseases: Secondary | ICD-10-CM

## 2021-09-24 DIAGNOSIS — U071 COVID-19: Principal | ICD-10-CM | POA: Diagnosis present

## 2021-09-24 DIAGNOSIS — E871 Hypo-osmolality and hyponatremia: Secondary | ICD-10-CM | POA: Diagnosis present

## 2021-09-24 DIAGNOSIS — G9341 Metabolic encephalopathy: Secondary | ICD-10-CM | POA: Diagnosis present

## 2021-09-24 DIAGNOSIS — F1010 Alcohol abuse, uncomplicated: Secondary | ICD-10-CM | POA: Diagnosis present

## 2021-09-24 DIAGNOSIS — F17210 Nicotine dependence, cigarettes, uncomplicated: Secondary | ICD-10-CM | POA: Diagnosis present

## 2021-09-24 DIAGNOSIS — Z87440 Personal history of urinary (tract) infections: Secondary | ICD-10-CM

## 2021-09-24 DIAGNOSIS — G894 Chronic pain syndrome: Secondary | ICD-10-CM | POA: Diagnosis present

## 2021-09-24 DIAGNOSIS — G4700 Insomnia, unspecified: Secondary | ICD-10-CM | POA: Diagnosis present

## 2021-09-24 DIAGNOSIS — R64 Cachexia: Secondary | ICD-10-CM | POA: Diagnosis present

## 2021-09-24 DIAGNOSIS — F419 Anxiety disorder, unspecified: Secondary | ICD-10-CM | POA: Diagnosis present

## 2021-09-24 DIAGNOSIS — E43 Unspecified severe protein-calorie malnutrition: Secondary | ICD-10-CM | POA: Diagnosis present

## 2021-09-24 DIAGNOSIS — Z79891 Long term (current) use of opiate analgesic: Secondary | ICD-10-CM

## 2021-09-24 DIAGNOSIS — J9601 Acute respiratory failure with hypoxia: Secondary | ICD-10-CM | POA: Diagnosis present

## 2021-09-24 NOTE — ED Triage Notes (Addendum)
Patient BIB EMS from home. Husband called EMS because pt was not acting like herself. Upon arrival pt A&O x 4, but states she needs mental health help. Pt does not elaborate. 86% RA, Hx of COPD, depression and anxiety, non compliant with meds.

## 2021-09-25 ENCOUNTER — Emergency Department (HOSPITAL_COMMUNITY): Payer: PPO

## 2021-09-25 DIAGNOSIS — R64 Cachexia: Secondary | ICD-10-CM | POA: Diagnosis present

## 2021-09-25 DIAGNOSIS — F1010 Alcohol abuse, uncomplicated: Secondary | ICD-10-CM | POA: Diagnosis present

## 2021-09-25 DIAGNOSIS — J9601 Acute respiratory failure with hypoxia: Secondary | ICD-10-CM | POA: Diagnosis present

## 2021-09-25 DIAGNOSIS — F32A Depression, unspecified: Secondary | ICD-10-CM | POA: Diagnosis present

## 2021-09-25 DIAGNOSIS — Z7951 Long term (current) use of inhaled steroids: Secondary | ICD-10-CM | POA: Diagnosis not present

## 2021-09-25 DIAGNOSIS — E43 Unspecified severe protein-calorie malnutrition: Secondary | ICD-10-CM | POA: Diagnosis present

## 2021-09-25 DIAGNOSIS — Z79899 Other long term (current) drug therapy: Secondary | ICD-10-CM | POA: Diagnosis not present

## 2021-09-25 DIAGNOSIS — F17210 Nicotine dependence, cigarettes, uncomplicated: Secondary | ICD-10-CM | POA: Diagnosis present

## 2021-09-25 DIAGNOSIS — Z716 Tobacco abuse counseling: Secondary | ICD-10-CM | POA: Diagnosis not present

## 2021-09-25 DIAGNOSIS — J441 Chronic obstructive pulmonary disease with (acute) exacerbation: Secondary | ICD-10-CM | POA: Diagnosis present

## 2021-09-25 DIAGNOSIS — E871 Hypo-osmolality and hyponatremia: Secondary | ICD-10-CM | POA: Diagnosis present

## 2021-09-25 DIAGNOSIS — Z87440 Personal history of urinary (tract) infections: Secondary | ICD-10-CM | POA: Diagnosis not present

## 2021-09-25 DIAGNOSIS — Z8619 Personal history of other infectious and parasitic diseases: Secondary | ICD-10-CM | POA: Diagnosis not present

## 2021-09-25 DIAGNOSIS — Z681 Body mass index (BMI) 19 or less, adult: Secondary | ICD-10-CM | POA: Diagnosis not present

## 2021-09-25 DIAGNOSIS — G894 Chronic pain syndrome: Secondary | ICD-10-CM | POA: Diagnosis present

## 2021-09-25 DIAGNOSIS — F419 Anxiety disorder, unspecified: Secondary | ICD-10-CM | POA: Diagnosis present

## 2021-09-25 DIAGNOSIS — F418 Other specified anxiety disorders: Secondary | ICD-10-CM | POA: Diagnosis not present

## 2021-09-25 DIAGNOSIS — U071 COVID-19: Secondary | ICD-10-CM | POA: Diagnosis present

## 2021-09-25 DIAGNOSIS — G4700 Insomnia, unspecified: Secondary | ICD-10-CM | POA: Diagnosis present

## 2021-09-25 DIAGNOSIS — Z79891 Long term (current) use of opiate analgesic: Secondary | ICD-10-CM | POA: Diagnosis not present

## 2021-09-25 DIAGNOSIS — G9341 Metabolic encephalopathy: Secondary | ICD-10-CM | POA: Diagnosis present

## 2021-09-25 LAB — RENAL FUNCTION PANEL
Albumin: 2.9 g/dL — ABNORMAL LOW (ref 3.5–5.0)
Anion gap: 9 (ref 5–15)
BUN: 6 mg/dL (ref 6–20)
CO2: 26 mmol/L (ref 22–32)
Calcium: 8.1 mg/dL — ABNORMAL LOW (ref 8.9–10.3)
Chloride: 97 mmol/L — ABNORMAL LOW (ref 98–111)
Creatinine, Ser: 0.33 mg/dL — ABNORMAL LOW (ref 0.44–1.00)
GFR, Estimated: >60 mL/min (ref >60)
Glucose, Bld: 102 mg/dL — ABNORMAL HIGH (ref 70–99)
Phosphorus: 3.5 mg/dL (ref 2.5–4.6)
Potassium: 3.7 mmol/L (ref 3.5–5.1)
Sodium: 132 mmol/L — ABNORMAL LOW (ref 135–145)

## 2021-09-25 LAB — BLOOD GAS, VENOUS
Acid-Base Excess: 3.9 mmol/L — ABNORMAL HIGH (ref 0.0–2.0)
Bicarbonate: 31.1 mmol/L — ABNORMAL HIGH (ref 20.0–28.0)
O2 Saturation: 96.4 %
Patient temperature: 98.6
pCO2, Ven: 59.2 mmHg (ref 44.0–60.0)
pH, Ven: 7.34 (ref 7.250–7.430)
pO2, Ven: 81.5 mmHg — ABNORMAL HIGH (ref 32.0–45.0)

## 2021-09-25 LAB — URINALYSIS, ROUTINE W REFLEX MICROSCOPIC
Bilirubin Urine: NEGATIVE
Glucose, UA: NEGATIVE mg/dL
Hgb urine dipstick: NEGATIVE
Ketones, ur: 5 mg/dL — AB
Leukocytes,Ua: NEGATIVE
Nitrite: NEGATIVE
Protein, ur: 30 mg/dL — AB
Specific Gravity, Urine: 1.024 (ref 1.005–1.030)
pH: 5 (ref 5.0–8.0)

## 2021-09-25 LAB — RAPID URINE DRUG SCREEN, HOSP PERFORMED
Amphetamines: NOT DETECTED
Barbiturates: NOT DETECTED
Benzodiazepines: NOT DETECTED
Cocaine: NOT DETECTED
Opiates: POSITIVE — AB
Tetrahydrocannabinol: NOT DETECTED

## 2021-09-25 LAB — CBC WITH DIFFERENTIAL/PLATELET
Abs Immature Granulocytes: 0.02 10*3/uL (ref 0.00–0.07)
Basophils Absolute: 0 10*3/uL (ref 0.0–0.1)
Basophils Relative: 1 %
Eosinophils Absolute: 0 10*3/uL (ref 0.0–0.5)
Eosinophils Relative: 0 %
HCT: 44.2 % (ref 36.0–46.0)
Hemoglobin: 15.1 g/dL — ABNORMAL HIGH (ref 12.0–15.0)
Immature Granulocytes: 0 %
Lymphocytes Relative: 4 %
Lymphs Abs: 0.3 10*3/uL — ABNORMAL LOW (ref 0.7–4.0)
MCH: 31.3 pg (ref 26.0–34.0)
MCHC: 34.2 g/dL (ref 30.0–36.0)
MCV: 91.7 fL (ref 80.0–100.0)
Monocytes Absolute: 0.6 10*3/uL (ref 0.1–1.0)
Monocytes Relative: 10 %
Neutro Abs: 5.7 10*3/uL (ref 1.7–7.7)
Neutrophils Relative %: 85 %
Platelets: 271 10*3/uL (ref 150–400)
RBC: 4.82 MIL/uL (ref 3.87–5.11)
RDW: 14.1 % (ref 11.5–15.5)
WBC: 6.7 10*3/uL (ref 4.0–10.5)
nRBC: 0 % (ref 0.0–0.2)

## 2021-09-25 LAB — COMPREHENSIVE METABOLIC PANEL
ALT: 18 U/L (ref 0–44)
AST: 30 U/L (ref 15–41)
Albumin: 3.1 g/dL — ABNORMAL LOW (ref 3.5–5.0)
Alkaline Phosphatase: 96 U/L (ref 38–126)
Anion gap: 8 (ref 5–15)
BUN: 10 mg/dL (ref 6–20)
CO2: 29 mmol/L (ref 22–32)
Calcium: 8.6 mg/dL — ABNORMAL LOW (ref 8.9–10.3)
Chloride: 90 mmol/L — ABNORMAL LOW (ref 98–111)
Creatinine, Ser: 0.48 mg/dL (ref 0.44–1.00)
GFR, Estimated: >60 mL/min (ref >60)
Glucose, Bld: 92 mg/dL (ref 70–99)
Potassium: 4.1 mmol/L (ref 3.5–5.1)
Sodium: 127 mmol/L — ABNORMAL LOW (ref 135–145)
Total Bilirubin: 0.5 mg/dL (ref 0.3–1.2)
Total Protein: 7.4 g/dL (ref 6.5–8.1)

## 2021-09-25 LAB — RESP PANEL BY RT-PCR (FLU A&B, COVID) ARPGX2
Influenza A by PCR: NEGATIVE
Influenza B by PCR: NEGATIVE
SARS Coronavirus 2 by RT PCR: POSITIVE — AB

## 2021-09-25 LAB — HIV ANTIBODY (ROUTINE TESTING W REFLEX): HIV Screen 4th Generation wRfx: NONREACTIVE

## 2021-09-25 MED ORDER — DOCUSATE SODIUM 100 MG PO CAPS
100.0000 mg | ORAL_CAPSULE | Freq: Two times a day (BID) | ORAL | Status: DC
  Administered 2021-09-25 – 2021-09-29 (×9): 100 mg via ORAL
  Filled 2021-09-25 (×9): qty 1

## 2021-09-25 MED ORDER — OXYCODONE HCL 5 MG PO TABS
10.0000 mg | ORAL_TABLET | Freq: Once | ORAL | Status: AC
  Administered 2021-09-25: 02:00:00 10 mg via ORAL
  Filled 2021-09-25: qty 2

## 2021-09-25 MED ORDER — THIAMINE HCL 100 MG PO TABS
100.0000 mg | ORAL_TABLET | Freq: Every day | ORAL | Status: DC
  Administered 2021-09-25 – 2021-09-29 (×5): 100 mg via ORAL
  Filled 2021-09-25 (×5): qty 1

## 2021-09-25 MED ORDER — ADULT MULTIVITAMIN W/MINERALS CH
1.0000 | ORAL_TABLET | Freq: Every day | ORAL | Status: DC
  Administered 2021-09-25 – 2021-09-29 (×5): 1 via ORAL
  Filled 2021-09-25 (×5): qty 1

## 2021-09-25 MED ORDER — FOLIC ACID 1 MG PO TABS
1.0000 mg | ORAL_TABLET | Freq: Every day | ORAL | Status: DC
  Administered 2021-09-25 – 2021-09-29 (×5): 1 mg via ORAL
  Filled 2021-09-25 (×5): qty 1

## 2021-09-25 MED ORDER — ALBUTEROL SULFATE (2.5 MG/3ML) 0.083% IN NEBU
INHALATION_SOLUTION | RESPIRATORY_TRACT | Status: AC
  Administered 2021-09-25: 02:00:00 10 mg
  Filled 2021-09-25: qty 12

## 2021-09-25 MED ORDER — IPRATROPIUM-ALBUTEROL 0.5-2.5 (3) MG/3ML IN SOLN
3.0000 mL | Freq: Three times a day (TID) | RESPIRATORY_TRACT | Status: DC
  Administered 2021-09-26 – 2021-09-27 (×4): 3 mL via RESPIRATORY_TRACT
  Filled 2021-09-25 (×4): qty 3

## 2021-09-25 MED ORDER — LORAZEPAM 0.5 MG PO TABS
0.5000 mg | ORAL_TABLET | Freq: Three times a day (TID) | ORAL | Status: DC | PRN
  Administered 2021-09-26 (×2): 0.5 mg via ORAL
  Filled 2021-09-25 (×2): qty 1

## 2021-09-25 MED ORDER — ALBUTEROL (5 MG/ML) CONTINUOUS INHALATION SOLN
10.0000 mg/h | INHALATION_SOLUTION | Freq: Once | RESPIRATORY_TRACT | Status: DC
  Filled 2021-09-25: qty 20

## 2021-09-25 MED ORDER — METHYLPREDNISOLONE SODIUM SUCC 40 MG IJ SOLR
0.5000 mg/kg | Freq: Two times a day (BID) | INTRAMUSCULAR | Status: DC
  Administered 2021-09-25 – 2021-09-27 (×4): 26.8 mg via INTRAVENOUS
  Filled 2021-09-25 (×4): qty 1

## 2021-09-25 MED ORDER — HYDROCOD POLST-CPM POLST ER 10-8 MG/5ML PO SUER
5.0000 mL | Freq: Two times a day (BID) | ORAL | Status: DC | PRN
  Administered 2021-09-26 – 2021-09-28 (×3): 5 mL via ORAL
  Filled 2021-09-25 (×3): qty 5

## 2021-09-25 MED ORDER — ONDANSETRON HCL 4 MG/2ML IJ SOLN
4.0000 mg | Freq: Four times a day (QID) | INTRAMUSCULAR | Status: DC | PRN
  Administered 2021-09-28: 4 mg via INTRAVENOUS
  Filled 2021-09-25: qty 2

## 2021-09-25 MED ORDER — SODIUM CHLORIDE 0.9 % IV SOLN: 100.0000 mg | Freq: Every day | INTRAVENOUS | Status: DC

## 2021-09-25 MED ORDER — ONDANSETRON HCL 4 MG PO TABS: 4.0000 mg | ORAL_TABLET | Freq: Four times a day (QID) | ORAL | Status: DC | PRN

## 2021-09-25 MED ORDER — GUAIFENESIN-DM 100-10 MG/5ML PO SYRP
10.0000 mL | ORAL_SOLUTION | ORAL | Status: DC | PRN
  Administered 2021-09-26 – 2021-09-28 (×5): 10 mL via ORAL
  Filled 2021-09-25 (×5): qty 10

## 2021-09-25 MED ORDER — ALBUTEROL SULFATE (2.5 MG/3ML) 0.083% IN NEBU: 2.5000 mg | INHALATION_SOLUTION | RESPIRATORY_TRACT | Status: DC | PRN

## 2021-09-25 MED ORDER — IPRATROPIUM-ALBUTEROL 0.5-2.5 (3) MG/3ML IN SOLN
3.0000 mL | Freq: Once | RESPIRATORY_TRACT | Status: AC
  Administered 2021-09-25: 01:00:00 3 mL via RESPIRATORY_TRACT
  Filled 2021-09-25: qty 3

## 2021-09-25 MED ORDER — SODIUM CHLORIDE 0.9 % IV SOLN: INTRAVENOUS | Status: DC

## 2021-09-25 MED ORDER — SODIUM CHLORIDE 0.9 % IV SOLN: 200.0000 mg | Freq: Once | INTRAVENOUS | Status: DC

## 2021-09-25 MED ORDER — SODIUM CHLORIDE 0.9 % IV SOLN
100.0000 mg | INTRAVENOUS | Status: DC
  Administered 2021-09-26 – 2021-09-27 (×2): 100 mg via INTRAVENOUS
  Filled 2021-09-25 (×2): qty 20

## 2021-09-25 MED ORDER — PREDNISONE 50 MG PO TABS: 50.0000 mg | ORAL_TABLET | Freq: Every day | ORAL | Status: DC

## 2021-09-25 MED ORDER — ACETAMINOPHEN 325 MG PO TABS
650.0000 mg | ORAL_TABLET | Freq: Four times a day (QID) | ORAL | Status: DC | PRN
  Administered 2021-09-25: 20:00:00 650 mg via ORAL
  Filled 2021-09-25: qty 2

## 2021-09-25 MED ORDER — ACETAMINOPHEN 650 MG RE SUPP: 650.0000 mg | Freq: Four times a day (QID) | RECTAL | Status: DC | PRN

## 2021-09-25 MED ORDER — SODIUM CHLORIDE 0.9 % IV BOLUS
1000.0000 mL | Freq: Once | INTRAVENOUS | Status: AC
  Administered 2021-09-25: 02:00:00 1000 mL via INTRAVENOUS

## 2021-09-25 MED ORDER — METHYLPREDNISOLONE SODIUM SUCC 125 MG IJ SOLR
125.0000 mg | Freq: Once | INTRAMUSCULAR | Status: AC
  Administered 2021-09-25: 02:00:00 125 mg via INTRAVENOUS
  Filled 2021-09-25: qty 2

## 2021-09-25 MED ORDER — ENOXAPARIN SODIUM 40 MG/0.4ML IJ SOSY
40.0000 mg | PREFILLED_SYRINGE | INTRAMUSCULAR | Status: DC
  Filled 2021-09-25 (×3): qty 0.4

## 2021-09-25 MED ORDER — ASCORBIC ACID 500 MG PO TABS
500.0000 mg | ORAL_TABLET | Freq: Every day | ORAL | Status: DC
  Administered 2021-09-25 – 2021-09-29 (×5): 500 mg via ORAL
  Filled 2021-09-25 (×5): qty 1

## 2021-09-25 MED ORDER — ZINC SULFATE 220 (50 ZN) MG PO CAPS
220.0000 mg | ORAL_CAPSULE | Freq: Every day | ORAL | Status: DC
  Administered 2021-09-25 – 2021-09-29 (×5): 220 mg via ORAL
  Filled 2021-09-25 (×5): qty 1

## 2021-09-25 MED ORDER — OXYCODONE HCL 5 MG PO TABS
10.0000 mg | ORAL_TABLET | ORAL | Status: DC | PRN
  Administered 2021-09-25 – 2021-09-29 (×13): 10 mg via ORAL
  Filled 2021-09-25 (×13): qty 2

## 2021-09-25 MED ORDER — SODIUM CHLORIDE 0.9 % IV SOLN
200.0000 mg | Freq: Once | INTRAVENOUS | Status: AC
  Administered 2021-09-25: 13:00:00 200 mg via INTRAVENOUS
  Filled 2021-09-25: qty 40

## 2021-09-25 MED ORDER — IPRATROPIUM-ALBUTEROL 0.5-2.5 (3) MG/3ML IN SOLN
3.0000 mL | Freq: Four times a day (QID) | RESPIRATORY_TRACT | Status: DC
  Administered 2021-09-25 (×3): 3 mL via RESPIRATORY_TRACT
  Filled 2021-09-25 (×3): qty 3

## 2021-09-25 NOTE — Progress Notes (Signed)
Carryover admit to day time admitter.  I discussed the patient's case with the EDP, Couture, Cortni, PA.  Per these discussions, this is a:  60 year old female with history of COPD, who is being admitted for acute COPD exacerbation associated with acute hypoxic respiratory distress.  COVID-19 PCR is also positive.  However, duration of her respiratory symptoms are unclear at this time, as the patient is also mildly confused at this time.  In setting of no known baseline supplemental oxygen requirements, initial O2 sats noted to be in the mid 80s on room air, subsequent proving into the mid 90s on 3 L nasal cannula.  Chest x-ray reportedly clear.  I Placed an admission order to med telemetry for further evaluation and management of acute COPD exacerbation.  I  also placed preliminary admission orders via the adult multi morbid order set.  I have placed orders for scheduled duo nebulizer treatments as well as prn albuterol.   Newton Pigg, DO Hospitalist

## 2021-09-25 NOTE — ED Provider Notes (Signed)
[facility] DEPT Provider Note   CSN: [id number] Arrival date & time: 09/24/21  2315     History No chief complaint on file.   Jennifer Mcdaniel is a 60 y.o. female.  HPI  60 year old female with a history of anxiety/depression, emphysema, UTI, COPD, who presents to the emergency department today for psychiatric evaluation.  Patient states that her husband wanted her to come to the hospital because he felt she was not acting like herself.  He was concerned about her anxiety and depression and felt she needed to be evaluated by behavioral specialist.  She denies any SI HI or AVH.  She is on medication for her anxiety and depression.  She was also noted to be hypoxic and states that she has been feeling short of breath for some time.  She is not had any fevers and denies chest pain.  She does report some urinary urgency and suprapubic discomfort that feels similar to prior UTIs.   Past Medical History:  Diagnosis Date   Anxiety    Depression    Pulmonary emphysema [facility] [place])     Patient Active Problem List   Diagnosis Date Noted   Acute exacerbation of chronic obstructive pulmonary disease (COPD) ([place]) 09/25/2021   Clostridioides difficile infection; [facility]  02/12/2020   Pneumonia 02/10/2020   Depression 02/10/2020   Proctocolitis 02/10/2020   Lactic acidosis 02/10/2020   Acute lower UTI 02/10/2020   Sepsis ([place]) 02/10/2020   COPD with acute exacerbation ([place]) 02/10/2020    Past Surgical History:  Procedure Laterality Date   BACK SURGERY       OB History   No obstetric history on file.     History reviewed. No pertinent family history.  Social History   Tobacco Use   Smoking status: Former    Types: Cigarettes   Smokeless tobacco: Former  Scientific laboratory technician Use: Never used    Home Medications Prior to Admission medications   Medication Sig Start Date End Date Taking? Authorizing Provider  albuterol (VENTOLIN HFA)  108 (90 Base) MCG/ACT inhaler Inhale 2 puffs into the lungs every 4 (four) hours as needed for wheezing or shortness of breath.  01/12/20   [provider]  ALPRAZolam Duanne Moron) 0.5 MG tablet Take 0.5 mg by mouth 4 (four) times daily. 02/08/20   [provider]  ARIPiprazole (ABILIFY) 5 MG tablet Take 5 mg by mouth daily. 11/16/19   [provider]  budesonide-formoterol (SYMBICORT) 160-4.5 MCG/ACT inhaler Inhale 2 puffs into the lungs in the morning and at bedtime.  01/12/20 02/11/20  [provider]  DULoxetine (CYMBALTA) 60 MG capsule Take 120 mg by mouth daily. 01/17/20   [provider]  Multiple Vitamin (MULTIVITAMIN WITH MINERALS) TABS tablet Take 1 tablet by mouth daily.    [provider]  naproxen sodium (ALEVE) 220 MG tablet Take 440 mg by mouth 2 (two) times daily as needed (pain).    [provider]  nicotine (NICODERM CQ - DOSED IN MG/24 HOURS) 21 mg/24hr patch Place 21 mg onto the skin daily.  01/12/20   [provider]  oxyCODONE (OXYCONTIN) 20 mg 12 hr tablet Take 20 mg by mouth every 12 (twelve) hours.  10/30/19   [provider]  Oxycodone HCl 10 MG TABS Take 10 mg by mouth 2 (two) times daily as needed (pain).  08/09/15   [provider]  pregabalin (LYRICA) 200 MG capsule Take 200 mg by mouth 3 (three) times  daily. 01/30/20   [provider]  tiZANidine (ZANAFLEX) 4 MG tablet Take 4 mg by mouth in the morning and at bedtime.  03/02/13   [provider]    Allergies    Patient has no known allergies.  Review of Systems   Review of Systems  Constitutional:  Negative for chills and fever.  HENT:  Negative for ear pain and sore throat.   Eyes:  Negative for visual disturbance.  Respiratory:  Positive for cough and shortness of breath.   Cardiovascular:  Negative for chest pain.  Gastrointestinal:  Negative for abdominal pain, constipation, diarrhea, nausea and vomiting.   Genitourinary:  Positive for urgency. Negative for dysuria and hematuria.  Musculoskeletal:  Negative for back pain.  Skin:  Negative for rash.  Neurological:  Negative for headaches.  All other systems reviewed and are negative.  Physical Exam Updated Vital Signs BP (!) 154/99    Pulse (!) 102    Temp 98.5 F (36.9 C) (Oral)    Resp 18    SpO2 93%   Physical Exam Vitals and nursing note reviewed.  Constitutional:      General: She is not in acute distress.    Appearance: She is well-developed.     Comments: cachectic  HENT:     Head: Normocephalic and atraumatic.  Eyes:     Conjunctiva/sclera: Conjunctivae normal.  Cardiovascular:     Rate and Rhythm: Normal rate and regular rhythm.     Heart sounds: Normal heart sounds. No murmur heard. Pulmonary:     Effort: Pulmonary effort is normal. No respiratory distress.     Breath sounds: Wheezing present.  Abdominal:     General: Bowel sounds are normal.     Palpations: Abdomen is soft.     Tenderness: There is no abdominal tenderness. There is no guarding or rebound.  Musculoskeletal:        General: No swelling.     Cervical back: Neck supple.  Skin:    General: Skin is warm and dry.     Capillary Refill: Capillary refill takes less than 2 seconds.  Neurological:     Mental Status: She is alert.  Psychiatric:        Mood and Affect: Mood is depressed. Affect is flat.        Behavior: Behavior is withdrawn. Behavior is cooperative.        Thought Content: Thought content does not include homicidal or suicidal ideation. Thought content does not include homicidal or suicidal plan.    ED Results / Procedures / Treatments   Labs (all labs ordered are listed, but only abnormal results are displayed) Labs Reviewed  RESP PANEL BY RT-PCR (FLU A&B, COVID) ARPGX2 - Abnormal; Notable for the following components:      Result Value   SARS Coronavirus 2 by RT PCR POSITIVE (*)    All other components within normal limits  CBC WITH  DIFFERENTIAL/PLATELET - Abnormal; Notable for the following components:   Hemoglobin 15.1 (*)    Lymphs Abs 0.3 (*)    All other components within normal limits  COMPREHENSIVE METABOLIC PANEL - Abnormal; Notable for the following components:   Sodium 127 (*)    Chloride 90 (*)    Calcium 8.6 (*)    Albumin 3.1 (*)    All other components within normal limits  URINALYSIS, ROUTINE W REFLEX MICROSCOPIC - Abnormal; Notable for the following components:   APPearance HAZY (*)    Ketones, ur 5 (*)  Protein, ur 30 (*)    Bacteria, UA RARE (*)    All other components within normal limits  BLOOD GAS, VENOUS - Abnormal; Notable for the following components:   pO2, Ven 81.5 (*)    Bicarbonate 31.1 (*)    Acid-Base Excess 3.9 (*)    All other components within normal limits  RAPID URINE DRUG SCREEN, HOSP PERFORMED - Abnormal; Notable for the following components:   Opiates POSITIVE (*)    All other components within normal limits  HIV ANTIBODY (ROUTINE TESTING W REFLEX)    EKG EKG Interpretation  Date/Time:  Friday September 25 2021 01:18:36 EST Ventricular Rate:  103 PR Interval:  158 QRS Duration: 86 QT Interval:  356 QTC Calculation: 466 R Axis:   -72 Text Interpretation: Sinus tachycardia Biatrial enlargement Left anterior fascicular block Probable left ventricular hypertrophy Anterior Q waves, possibly due to LVH  Nonspecific ST changes Similar to May 2021 tracing Confirmed by Nanda Quinton [phone]) on 09/25/2021 1:55:29 AM  Radiology DG Chest 2 View  Result Date: 09/25/2021 CLINICAL DATA:  Hypoxia, altered level of consciousness EXAM: CHEST - 2 VIEW COMPARISON:  02/09/2020 FINDINGS: Frontal and lateral views of the chest demonstrate an unremarkable cardiac silhouette. Chronic background emphysema, without superimposed airspace disease, effusion, or pneumothorax. Stable postsurgical changes of the thoracolumbar spine. IMPRESSION: 1. Stable emphysema.  No acute airspace disease.  Electronically Signed   By: Randa Ngo M.D.   On: 09/25/2021 01:00   CT Head Wo Contrast  Result Date: 09/25/2021 CLINICAL DATA:  Altered mental status. EXAM: CT HEAD WITHOUT CONTRAST TECHNIQUE: Contiguous axial images were obtained from the base of the skull through the vertex without intravenous contrast. COMPARISON:  None. FINDINGS: Brain: No evidence of acute infarction, hemorrhage, hydrocephalus, extra-axial collection or mass lesion/mass effect. There is mild cerebral atrophy and small vessel disease, unremarkable cerebellum and brainstem. The ventricles are normal in size and position. Vascular: No hyperdense vessel or unexpected calcification. Skull: Normal. Negative for fracture or focal lesion. Sinuses/Orbits: No acute finding.  The nasal septum deviates left. Other: None. IMPRESSION: No acute intracranial CT findings. Mild features of atrophy and small vessel disease. Electronically Signed   By: Telford Nab M.D.   On: 09/25/2021 02:07    Procedures Procedures   CRITICAL CARE Performed by: Rodney Booze   Total critical care time: 36 minutes  Critical care time was exclusive of separately billable procedures and treating other patients.  Critical care was necessary to treat or prevent imminent or life-threatening deterioration.  Critical care was time spent personally by me on the following activities: development of treatment plan with patient and/or surrogate as well as nursing, discussions with consultants, evaluation of patient's response to treatment, examination of patient, obtaining history from patient or surrogate, ordering and performing treatments and interventions, ordering and review of laboratory studies, ordering and review of radiographic studies, pulse oximetry and re-evaluation of patient's condition.   Medications Ordered in ED Medications  albuterol (PROVENTIL,VENTOLIN) solution continuous neb (10 mg/hr Nebulization Not Given 09/25/21 0154)   acetaminophen (TYLENOL) tablet 650 mg (has no administration in time range)    Or  acetaminophen (TYLENOL) suppository 650 mg (has no administration in time range)  ipratropium-albuterol (DUONEB) 0.5-2.5 (3) MG/3ML nebulizer solution 3 mL (has no administration in time range)  albuterol (PROVENTIL) (2.5 MG/3ML) 0.083% nebulizer solution 2.5 mg (has no administration in time range)  methylPREDNISolone sodium succinate (SOLU-MEDROL) 125 mg/2 mL injection 125 mg (125 mg Intravenous Given 09/25/21 0134)  ipratropium-albuterol (DUONEB) 0.5-2.5 (3) MG/3ML nebulizer solution 3 mL (3 mLs Nebulization Given 09/25/21 0120)  sodium chloride 0.9 % bolus 1,000 mL (1,000 mLs Intravenous New Bag/Given 09/25/21 0202)  oxyCODONE (Oxy IR/ROXICODONE) immediate release tablet 10 mg (10 mg Oral Given 09/25/21 0201)  albuterol (PROVENTIL) (2.5 MG/3ML) 0.083% nebulizer solution (10 mg  Given 09/25/21 0202)    ED Course  I have reviewed the triage vital signs and the nursing notes.  Pertinent labs & imaging results that were available during my care of the patient were reviewed by me and considered in my medical decision making (see chart for details).    MDM Rules/Calculators/A&P                          60 y/o female presenting for a psych eval. She is found to by hypoxic. She denies si/hi/avh on my eval.   Reviewed/interpreted labs  CBC w/o leukocytosis, elevated hgb, likely hemoconcentration CMP with hyponatremia, normal cr and lfts UA neg for uti VBG w/o hypercarbia or acidosis UDS positive for opiates (has rx for this) COVID positive, flu neg  EKG similar to may 2021  Reviewed/interpreted imaging CXR - 1. Stable emphysema.  No acute airspace disease.  CT head ordered due to reports of ams by husband per ems. Unable to contact husband to corroborate hx. neg for acute changes  Pt given solumedrol, duo neb, cont albuterol neb. She remains hypoxic and is requiring 3l o2. She is covid positive. She  will require admission for this.   3:33 PM CONSULT with Dr. Velia Meyer who accepts patient for admission   Final Clinical Impression(s) / ED Diagnoses Final diagnoses:  COVID  Acute respiratory failure with hypoxia [facility])    Rx / DC Orders ED Discharge Orders     None        Rodney Booze, PA-C 09/25/21 [id number]    Margette Fast, MD 09/25/21 [phone]

## 2021-09-25 NOTE — ED Notes (Signed)
PT given breakfast tray  

## 2021-09-25 NOTE — ED Notes (Signed)
Ambulated pt to restroom on O2 monitor. Room air O2 was consistently 80-84% . Pt dropped to 78-79 briefly while ambulating back to her room.

## 2021-09-25 NOTE — H&P (Signed)
History and Physical    Jennifer Mcdaniel [id number] DOB: 09-12-1961 DOA: 09/24/2021  PCP: Karle Plumber, MD  Patient coming from: Home  Chief Complaint: dyspnea  HPI: Jennifer Mcdaniel is a 60 y.o. female with medical history significant of COPD, anxiety, depression, chronic pain. Presenting with dyspnea, confusion. History is from chart review as patient is confused. She apparently had some dyspnea over the last couple of days. She has had some confusion. She presented to the ED for a psych eval but during their evaluation they noted her to be hypoxic. She was found to be COVID positive.   ED Course: She was hypoxic on RA. She was started on 3 - 4L [place]. CXR was negative. She was positive for COVID. She was started on nebs, steroids. [facility] was called for admission.   Review of Systems:  Unable to obtain d/t mentation.  PMHx Past Medical History:  Diagnosis Date   Anxiety    Depression    Pulmonary emphysema ([facility])     PSHx Past Surgical History:  Procedure Laterality Date   BACK SURGERY      SocHx  reports that she has quit smoking. Her smoking use included cigarettes. She has quit using smokeless tobacco. No history on file for alcohol use and drug use.  No Known Allergies  FamHx History reviewed. No pertinent family history.  Prior to Admission medications   Medication Sig Start Date End Date Taking? Authorizing Provider  albuterol (VENTOLIN HFA) 108 (90 Base) MCG/ACT inhaler Inhale 2 puffs into the lungs every 4 (four) hours as needed for wheezing or shortness of breath.  01/12/20   [provider]  ALPRAZolam Prudy Feeler) 0.5 MG tablet Take 0.5 mg by mouth 4 (four) times daily. 02/08/20   [provider]  ARIPiprazole (ABILIFY) 2 MG tablet Take 2 mg by mouth daily. 07/28/21   [provider]  ARIPiprazole (ABILIFY) 5 MG tablet Take 5 mg by mouth daily. 11/16/19   [provider]  BREZTRI AEROSPHERE 160-9-4.8 MCG/ACT AERO Inhale 2 puffs  into the lungs 2 (two) times daily. 07/20/21   [provider]  budesonide-formoterol (SYMBICORT) 160-4.5 MCG/ACT inhaler Inhale 2 puffs into the lungs in the morning and at bedtime.  01/12/20 02/11/20  [provider]  buPROPion (WELLBUTRIN XL) 300 MG 24 hr tablet Take 300 mg by mouth daily. 09/02/21   [provider]  DULoxetine (CYMBALTA) 60 MG capsule Take 120 mg by mouth daily. 01/17/20   [provider]  mirtazapine (REMERON) 15 MG tablet Take 15 mg by mouth. 09/02/21   [provider]  Multiple Vitamin (MULTIVITAMIN WITH MINERALS) TABS tablet Take 1 tablet by mouth daily.    [provider]  naproxen sodium (ALEVE) 220 MG tablet Take 440 mg by mouth 2 (two) times daily as needed (pain).    [provider]  nicotine (NICODERM CQ - DOSED IN MG/24 HOURS) 21 mg/24hr patch Place 21 mg onto the skin daily.  01/12/20   [provider]  oxyCODONE (OXYCONTIN) 20 mg 12 hr tablet Take 20 mg by mouth every 12 (twelve) hours.  10/30/19   [provider]  Oxycodone HCl 10 MG TABS Take 10 mg by mouth 2 (two) times daily as needed (pain).  08/09/15   [provider]  pregabalin (LYRICA) 150 MG capsule Take 150 mg by mouth 3 (three) times daily. 09/11/21   [provider]  pregabalin (LYRICA) 200 MG capsule Take 200 mg by mouth 3 (three) times  daily. 01/30/20   [provider]  tiZANidine (ZANAFLEX) 4 MG tablet Take 4 mg by mouth in the morning and at bedtime.  03/02/13   [provider]  traZODone (DESYREL) 50 MG tablet Take 50 mg by mouth. 09/02/21   [provider]    Physical Exam: Vitals:   09/25/21 0602 09/25/21 0630 09/25/21 0730 09/25/21 0745  BP:  (!) 143/67 132/70   Pulse: 97 85 99   Resp: 18 17 16    Temp:      TempSrc:      SpO2: 94% 93% 90% 95%    General: 60 y.o. chronically ill appearing female resting on BSC in NAD Eyes: PERRL, normal sclera ENMT: Nares patent w/o  discharge, orophaynx clear, dentition poor, ears w/o discharge/lesions/ulcers Neck: Supple, trachea midline Cardiovascular: RRR, +S1, S2, no m/g/r, equal pulses throughout Respiratory: decreased at bases, soft scattered wheeze, increased WOB on 4L [place] GI: BS+, NDNT, no masses noted, no organomegaly noted MSK: No e/c/c Neuro: A&O x name only, no focal deficits Psyc: anxious, confused, cooperative  Labs on Admission: I have personally reviewed following labs and imaging studies  CBC: Recent Labs  Lab 09/25/21 0123  WBC 6.7  NEUTROABS 5.7  HGB 15.1*  HCT 44.2  MCV 91.7  PLT 271   Basic Metabolic Panel: Recent Labs  Lab 09/25/21 0123  NA 127*  K 4.1  CL 90*  CO2 29  GLUCOSE 92  BUN 10  CREATININE 0.48  CALCIUM 8.6*   GFR: CrCl cannot be calculated (Unknown ideal weight.). Liver Function Tests: Recent Labs  Lab 09/25/21 0123  AST 30  ALT 18  ALKPHOS 96  BILITOT 0.5  PROT 7.4  ALBUMIN 3.1*   No results for input(s): LIPASE, AMYLASE in the last 168 hours. No results for input(s): AMMONIA in the last 168 hours. Coagulation Profile: No results for input(s): INR, PROTIME in the last 168 hours. Cardiac Enzymes: No results for input(s): CKTOTAL, CKMB, CKMBINDEX, TROPONINI in the last 168 hours. BNP (last 3 results) No results for input(s): PROBNP in the last 8760 hours. HbA1C: No results for input(s): HGBA1C in the last 72 hours. CBG: No results for input(s): GLUCAP in the last 168 hours. Lipid Profile: No results for input(s): CHOL, HDL, LDLCALC, TRIG, CHOLHDL, LDLDIRECT in the last 72 hours. Thyroid Function Tests: No results for input(s): TSH, T4TOTAL, FREET4, T3FREE, THYROIDAB in the last 72 hours. Anemia Panel: No results for input(s): VITAMINB12, FOLATE, FERRITIN, TIBC, IRON, RETICCTPCT in the last 72 hours. Urine analysis:    Component Value Date/Time   COLORURINE YELLOW 09/25/2021 0032   APPEARANCEUR HAZY (A) 09/25/2021 0032   LABSPEC 1.024 09/25/2021  0032   PHURINE 5.0 09/25/2021 0032   GLUCOSEU NEGATIVE 09/25/2021 0032   HGBUR NEGATIVE 09/25/2021 0032   BILIRUBINUR NEGATIVE 09/25/2021 0032   KETONESUR 5 (A) 09/25/2021 0032   PROTEINUR 30 (A) 09/25/2021 0032   NITRITE NEGATIVE 09/25/2021 0032   LEUKOCYTESUR NEGATIVE 09/25/2021 0032    Radiological Exams on Admission: DG Chest 2 View  Result Date: 09/25/2021 CLINICAL DATA:  Hypoxia, altered level of consciousness EXAM: CHEST - 2 VIEW COMPARISON:  02/09/2020 FINDINGS: Frontal and lateral views of the chest demonstrate an unremarkable cardiac silhouette. Chronic background emphysema, without superimposed airspace disease, effusion, or pneumothorax. Stable postsurgical changes of the thoracolumbar spine. IMPRESSION: 1. Stable emphysema.  No acute airspace disease. Electronically Signed   By: 02/11/2020 M.D.   On: 09/25/2021 01:00   CT Head Wo Contrast  Result Date: 09/25/2021 CLINICAL DATA:  Altered mental status. EXAM: CT HEAD WITHOUT CONTRAST TECHNIQUE: Contiguous axial images were obtained from the base of the skull through the vertex without intravenous contrast. COMPARISON:  None. FINDINGS: Brain: No evidence of acute infarction, hemorrhage, hydrocephalus, extra-axial collection or mass lesion/mass effect. There is mild cerebral atrophy and small vessel disease, unremarkable cerebellum and brainstem. The ventricles are normal in size and position. Vascular: No hyperdense vessel or unexpected calcification. Skull: Normal. Negative for fracture or focal lesion. Sinuses/Orbits: No acute finding.  The nasal septum deviates left. Other: None. IMPRESSION: No acute intracranial CT findings. Mild features of atrophy and small vessel disease. Electronically Signed   By: Almira Bar M.D.   On: 09/25/2021 02:07    EKG: Independently reviewed. Sinus tach, st elevation in V3 (repol effect?)  Assessment/Plan COPD exacerbation COVID 19 infection     - admitted to inpt, tele     - remdes,  steroids, nebs, guaifenesin, IS, FV     - follow inflammatory markers     - wean O2 as able  Acute metabolic encephalopathy     - possibly secondary to infection and hypoxia; treat her COVID, COPD     - she does have a high risk of polypharmacy; review her pain/anxiety regimen  Hyponatremia     - send urine studies (Uosm, UNa+)     - fluids; q6h renal fxn panels; limit Na+ to 8pts/day  Tobacco abuse     - counsel on abstention  Anxiety/Depression     - continue home regimen when confirmed  Chronic pain     - continue home regimen when confirmed  DVT prophylaxis: lovenox  Code Status: FULL  Family Communication: Tried to reach husband and son; received VM only  Consults called: None   Status is: Inpatient  Remains inpatient appropriate because: severity of illness  Louise Rawson A Ronaldo Miyamoto DO [facility]  If 7PM-7AM, please contact night-coverage [email or web address]  09/25/2021, 10:26 AM

## 2021-09-25 NOTE — ED Notes (Signed)
Set up breakfast tray for patient and assisted patient in calling family member, per patient request.

## 2021-09-25 NOTE — Plan of Care (Signed)
  Problem: Education: Goal: Knowledge of risk factors and measures for prevention of condition will improve Outcome: Progressing   Problem: Respiratory: Goal: Will maintain a patent airway Outcome: Progressing   

## 2021-09-25 NOTE — ED Notes (Signed)
Patient transported to X-ray 

## 2021-09-26 DIAGNOSIS — J441 Chronic obstructive pulmonary disease with (acute) exacerbation: Secondary | ICD-10-CM

## 2021-09-26 DIAGNOSIS — F418 Other specified anxiety disorders: Secondary | ICD-10-CM

## 2021-09-26 LAB — CBC WITH DIFFERENTIAL/PLATELET
Abs Immature Granulocytes: 0.02 10*3/uL (ref 0.00–0.07)
Basophils Absolute: 0 10*3/uL (ref 0.0–0.1)
Basophils Relative: 0 %
Eosinophils Absolute: 0 10*3/uL (ref 0.0–0.5)
Eosinophils Relative: 0 %
HCT: 45.8 % (ref 36.0–46.0)
Hemoglobin: 15.8 g/dL — ABNORMAL HIGH (ref 12.0–15.0)
Immature Granulocytes: 1 %
Lymphocytes Relative: 23 %
Lymphs Abs: 0.8 10*3/uL (ref 0.7–4.0)
MCH: 31.1 pg (ref 26.0–34.0)
MCHC: 34.5 g/dL (ref 30.0–36.0)
MCV: 90.2 fL (ref 80.0–100.0)
Monocytes Absolute: 0.9 10*3/uL (ref 0.1–1.0)
Monocytes Relative: 27 %
Neutro Abs: 1.7 10*3/uL (ref 1.7–7.7)
Neutrophils Relative %: 49 %
Platelets: 285 10*3/uL (ref 150–400)
RBC: 5.08 MIL/uL (ref 3.87–5.11)
RDW: 13.9 % (ref 11.5–15.5)
WBC: 3.4 10*3/uL — ABNORMAL LOW (ref 4.0–10.5)
nRBC: 0 % (ref 0.0–0.2)

## 2021-09-26 LAB — RENAL FUNCTION PANEL
Albumin: 2.8 g/dL — ABNORMAL LOW (ref 3.5–5.0)
Anion gap: 10 (ref 5–15)
BUN: 8 mg/dL (ref 6–20)
CO2: 27 mmol/L (ref 22–32)
Calcium: 8.1 mg/dL — ABNORMAL LOW (ref 8.9–10.3)
Chloride: 96 mmol/L — ABNORMAL LOW (ref 98–111)
Creatinine, Ser: 0.47 mg/dL (ref 0.44–1.00)
GFR, Estimated: >60 mL/min (ref >60)
Glucose, Bld: 89 mg/dL (ref 70–99)
Phosphorus: 3.4 mg/dL (ref 2.5–4.6)
Potassium: 3.3 mmol/L — ABNORMAL LOW (ref 3.5–5.1)
Sodium: 133 mmol/L — ABNORMAL LOW (ref 135–145)

## 2021-09-26 LAB — COMPREHENSIVE METABOLIC PANEL
ALT: 18 U/L (ref 0–44)
AST: 27 U/L (ref 15–41)
Albumin: 2.6 g/dL — ABNORMAL LOW (ref 3.5–5.0)
Alkaline Phosphatase: 79 U/L (ref 38–126)
Anion gap: 7 (ref 5–15)
BUN: 8 mg/dL (ref 6–20)
CO2: 28 mmol/L (ref 22–32)
Calcium: 8 mg/dL — ABNORMAL LOW (ref 8.9–10.3)
Chloride: 99 mmol/L (ref 98–111)
Creatinine, Ser: 0.4 mg/dL — ABNORMAL LOW (ref 0.44–1.00)
GFR, Estimated: >60 mL/min (ref >60)
Glucose, Bld: 85 mg/dL (ref 70–99)
Potassium: 3.2 mmol/L — ABNORMAL LOW (ref 3.5–5.1)
Sodium: 134 mmol/L — ABNORMAL LOW (ref 135–145)
Total Bilirubin: 0.4 mg/dL (ref 0.3–1.2)
Total Protein: 6.4 g/dL — ABNORMAL LOW (ref 6.5–8.1)

## 2021-09-26 LAB — D-DIMER, QUANTITATIVE: D-Dimer, Quant: 1.2 ug/mL-FEU — ABNORMAL HIGH (ref 0.00–0.50)

## 2021-09-26 LAB — FERRITIN: Ferritin: 21 ng/mL (ref 11–307)

## 2021-09-26 LAB — C-REACTIVE PROTEIN: CRP: 0.8 mg/dL (ref <1.0)

## 2021-09-26 MED ORDER — NICOTINE 21 MG/24HR TD PT24
21.0000 mg | MEDICATED_PATCH | Freq: Every day | TRANSDERMAL | Status: DC
  Administered 2021-09-26 – 2021-09-29 (×4): 21 mg via TRANSDERMAL
  Filled 2021-09-26 (×4): qty 1

## 2021-09-26 MED ORDER — SODIUM CHLORIDE 0.9 % IV SOLN: INTRAVENOUS | Status: DC

## 2021-09-26 MED ORDER — DULOXETINE HCL 30 MG PO CPEP
60.0000 mg | ORAL_CAPSULE | Freq: Two times a day (BID) | ORAL | Status: DC
  Administered 2021-09-26 – 2021-09-29 (×7): 60 mg via ORAL
  Filled 2021-09-26 (×7): qty 2

## 2021-09-26 MED ORDER — NALOXONE HCL 0.4 MG/ML IJ SOLN: 0.4000 mg | INTRAMUSCULAR | Status: DC | PRN

## 2021-09-26 MED ORDER — OXYCODONE HCL 10 MG PO TABS: 10.0000 mg | ORAL_TABLET | Freq: Two times a day (BID) | ORAL | Status: DC

## 2021-09-26 MED ORDER — ALPRAZOLAM 0.5 MG PO TABS: 0.5000 mg | ORAL_TABLET | Freq: Four times a day (QID) | ORAL | Status: DC | PRN

## 2021-09-26 MED ORDER — ADULT MULTIVITAMIN W/MINERALS CH: 1.0000 | ORAL_TABLET | Freq: Every day | ORAL | Status: DC

## 2021-09-26 MED ORDER — AZITHROMYCIN 250 MG PO TABS
250.0000 mg | ORAL_TABLET | Freq: Every day | ORAL | Status: DC
  Administered 2021-09-27 – 2021-09-29 (×3): 250 mg via ORAL
  Filled 2021-09-26 (×3): qty 1

## 2021-09-26 MED ORDER — BUPROPION HCL ER (XL) 300 MG PO TB24
300.0000 mg | ORAL_TABLET | Freq: Every day | ORAL | Status: DC
  Administered 2021-09-26: 300 mg via ORAL
  Filled 2021-09-26: qty 1

## 2021-09-26 MED ORDER — ARIPIPRAZOLE 2 MG PO TABS
2.0000 mg | ORAL_TABLET | Freq: Every day | ORAL | Status: DC
  Administered 2021-09-26 – 2021-09-29 (×4): 2 mg via ORAL
  Filled 2021-09-26 (×4): qty 1

## 2021-09-26 MED ORDER — AZITHROMYCIN 250 MG PO TABS
500.0000 mg | ORAL_TABLET | Freq: Every day | ORAL | Status: AC
  Administered 2021-09-26: 500 mg via ORAL
  Filled 2021-09-26: qty 2

## 2021-09-26 MED ORDER — POTASSIUM CHLORIDE CRYS ER 20 MEQ PO TBCR: 40.0000 meq | EXTENDED_RELEASE_TABLET | Freq: Once | ORAL | Status: DC

## 2021-09-26 MED ORDER — OXYCODONE HCL ER 20 MG PO T12A
20.0000 mg | EXTENDED_RELEASE_TABLET | Freq: Two times a day (BID) | ORAL | Status: DC
  Administered 2021-09-26 – 2021-09-29 (×7): 20 mg via ORAL
  Filled 2021-09-26 (×7): qty 1

## 2021-09-26 MED ORDER — TRAZODONE HCL 50 MG PO TABS: 50.0000 mg | ORAL_TABLET | Freq: Every evening | ORAL | Status: DC | PRN

## 2021-09-26 MED ORDER — POTASSIUM CHLORIDE CRYS ER 20 MEQ PO TBCR
40.0000 meq | EXTENDED_RELEASE_TABLET | Freq: Two times a day (BID) | ORAL | Status: AC
  Administered 2021-09-26 (×2): 40 meq via ORAL
  Filled 2021-09-26 (×2): qty 2

## 2021-09-26 MED ORDER — MIRTAZAPINE 15 MG PO TABS: 15.0000 mg | ORAL_TABLET | Freq: Every day | ORAL | Status: DC

## 2021-09-26 MED ORDER — PREGABALIN 75 MG PO CAPS
150.0000 mg | ORAL_CAPSULE | Freq: Three times a day (TID) | ORAL | Status: DC
  Administered 2021-09-26 – 2021-09-29 (×10): 150 mg via ORAL
  Filled 2021-09-26 (×10): qty 2

## 2021-09-26 MED ORDER — TIZANIDINE HCL 4 MG PO TABS
4.0000 mg | ORAL_TABLET | Freq: Three times a day (TID) | ORAL | Status: DC
  Administered 2021-09-26 – 2021-09-29 (×10): 4 mg via ORAL
  Filled 2021-09-26 (×10): qty 1

## 2021-09-26 NOTE — Consult Note (Signed)
Face To Face Psychiatry Consult  Patient Identification:  Jennifer Mcdaniel Date of Evaluation:  09/26/2021 Referring Provider:Dr Nevada Crane  History of Present Illness: Jennifer Mcdaniel is a 60 year old Caucasian, married female who was admitted due to dyspnea, confusion.  Initially husband is concerned about her confusion and wanted to have a psych evaluation however the emergency room patient found to be hypoxic and admitted on the medical floor.  Psychiatric consult was called for medication management.  Patient seen and chart reviewed.  Patient admitted on history of depression and anxiety.  Currently she is seeing a Philipp Ovens, [place] at [facility] for underlying depression and anxiety.  She is seeing the PA for more than 7 years.  She admitted taking too many medication but she felt much better on these medication.  She is not sure what brought her to the hospital but endorsed that her husband may have some concern because she was not doing well.  Today patient feel much better.  She is alert and oriented x3.  She is cooperative on the medical floor.  She denies any hallucination, paranoia, suicidal thoughts.  She recalled all her medication which are Cymbalta, Abilify, Wellbutrin, Xanax, mirtazapine, trazodone, Lyrica and pain medication.  When patient question why she is taking too many medication she replied that some of the medication her psychiatrist trying to see if that works better than others.  She recalled taking Wellbutrin to help for smoking but that did not help as much.  She also not taking trazodone and mirtazapine which was initiated for sleep.  However she like the Abilify which was recently added to help her depression along with Cymbalta and Xanax.  She endorsed her sleep most of the time is okay but there are nights when she struggle with insomnia.  She lives with her husband and she denies any issues in the relationship.  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  She endorsed history of back surgery and she is taking pain medication.  As per staff patient is slowly and gradually improving but is still sometimes has same question multiple times.  She feels some time very anxious about her medication.  Her UDS is positive for opiates.  Past Psychiatric History:  Patient denies any history of suicidal attempt or any psychiatric inpatient treatment.  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  Her outpatient psychiatrist is at [facility] and she is seeing [employer], [place].   Past Medical History:     Past Medical History:  Diagnosis Date   Anxiety    Depression    Pulmonary emphysema (HCC)        Past Surgical History:  Procedure Laterality Date   BACK SURGERY      Recent Results (from the past 2160 hour(s))  Urinalysis, Routine w reflex microscopic     Status: Abnormal   Collection Time: 09/25/21 12:32 AM  Result Value Ref Range   Color, Urine YELLOW YELLOW   APPearance HAZY (A) CLEAR   Specific Gravity, Urine 1.024 1.005 - 1.030   pH 5.0 5.0 - 8.0   Glucose, UA NEGATIVE NEGATIVE mg/dL   Hgb urine dipstick NEGATIVE NEGATIVE   Bilirubin Urine NEGATIVE NEGATIVE   Ketones, ur 5 (A) NEGATIVE mg/dL   Protein, ur 30 (A) NEGATIVE mg/dL   Nitrite NEGATIVE NEGATIVE   Leukocytes,Ua NEGATIVE NEGATIVE   RBC / HPF 0-5  0 - 5 RBC/hpf   WBC, UA 0-5 0 - 5 WBC/hpf   Bacteria, UA RARE (A) NONE SEEN   Squamous Epithelial / LPF 0-5 0 - 5   Mucus PRESENT    Hyaline Casts, UA PRESENT     Comment: Performed at [facility], [place] [address] [place], [place] [address]  Rapid urine drug screen (hospital performed)     Status: Abnormal   Collection Time: 09/25/21 12:32 AM  Result Value Ref Range   Opiates POSITIVE (A) NONE DETECTED   Cocaine NONE DETECTED NONE DETECTED   Benzodiazepines NONE DETECTED NONE DETECTED   Amphetamines NONE DETECTED NONE DETECTED   Tetrahydrocannabinol  NONE DETECTED NONE DETECTED   Barbiturates NONE DETECTED NONE DETECTED    Comment: (NOTE) DRUG SCREEN FOR MEDICAL PURPOSES ONLY.  IF CONFIRMATION IS NEEDED FOR ANY PURPOSE, NOTIFY LAB WITHIN 5 DAYS.  LOWEST DETECTABLE LIMITS FOR URINE DRUG SCREEN Drug Class                     Cutoff (ng/mL) Amphetamine and metabolites    1000 Barbiturate and metabolites    200 Benzodiazepine                 [id number] Tricyclics and metabolites     300 Opiates and metabolites        300 Cocaine and metabolites        300 THC                            50 Performed at [facility], [place] [address], [place], [place] [address]   CBC with Differential     Status: Abnormal   Collection Time: 09/25/21  1:23 AM  Result Value Ref Range   WBC 6.7 4.0 - 10.5 K/uL   RBC 4.82 3.87 - 5.11 MIL/uL   Hemoglobin 15.1 (H) 12.0 - 15.0 g/dL   HCT 44.2 36.0 - 46.0 %   MCV 91.7 80.0 - 100.0 fL   MCH 31.3 26.0 - 34.0 pg   MCHC 34.2 30.0 - 36.0 g/dL   RDW 14.1 11.5 - 15.5 %   Platelets 271 150 - 400 K/uL   nRBC 0.0 0.0 - 0.2 %   Neutrophils Relative % 85 %   Neutro Abs 5.7 1.7 - 7.7 K/uL   Lymphocytes Relative 4 %   Lymphs Abs 0.3 (L) 0.7 - 4.0 K/uL   Monocytes Relative 10 %   Monocytes Absolute 0.6 0.1 - 1.0 K/uL   Eosinophils Relative 0 %   Eosinophils Absolute 0.0 0.0 - 0.5 K/uL   Basophils Relative 1 %   Basophils Absolute 0.0 0.0 - 0.1 K/uL   Immature Granulocytes 0 %   Abs Immature Granulocytes 0.02 0.00 - 0.07 K/uL    Comment: Performed at [facility], [place] [address] [place], [place] [address]  Comprehensive metabolic panel     Status: Abnormal   Collection Time: 09/25/21  1:23 AM  Result Value Ref Range   Sodium 127 (L) 135 - 145 mmol/L   Potassium 4.1 3.5 - 5.1 mmol/L   Chloride 90 (L) 98 - 111 mmol/L   CO2 29 22 - 32 mmol/L   Glucose, Bld 92 70 - 99 mg/dL    Comment: Glucose reference range applies only to samples taken after fasting for at least 8 hours.    BUN 10 6 - 20 mg/dL   Creatinine,  Ser 0.48 0.44 - 1.00 mg/dL   Calcium 8.6 (L) 8.9 - 10.3 mg/dL   Total Protein 7.4 6.5 - 8.1 g/dL   Albumin 3.1 (L) 3.5 - 5.0 g/dL   AST 30 15 - 41 U/L   ALT 18 0 - 44 U/L   Alkaline Phosphatase 96 38 - 126 U/L   Total Bilirubin 0.5 0.3 - 1.2 mg/dL   GFR, Estimated >73 >71 mL/min    Comment: (NOTE) Calculated using the CKD-EPI Creatinine Equation (2021)    Anion gap 8 5 - 15    Comment: Performed at [facility], [address], [place], [place] [address]  Blood gas, venous (at [facility] and [facility], not at [facility])     Status: Abnormal   Collection Time: 09/25/21  1:23 AM  Result Value Ref Range   pH, Ven 7.340 7.250 - 7.430   pCO2, Ven 59.2 44.0 - 60.0 mmHg   pO2, Ven 81.5 (H) 32.0 - 45.0 mmHg   Bicarbonate 31.1 (H) 20.0 - 28.0 mmol/L   Acid-Base Excess 3.9 (H) 0.0 - 2.0 mmol/L   O2 Saturation 96.4 %   Patient temperature 98.6     Comment: Performed at [facility], [address] [place], [place] [address]  Resp Panel by RT-PCR (Flu A&B, Covid) Nasopharyngeal Swab     Status: Abnormal   Collection Time: 09/25/21  1:23 AM   Specimen: Nasopharyngeal Swab; Nasopharyngeal(NP) swabs in vial transport medium  Result Value Ref Range   SARS Coronavirus 2 by RT PCR POSITIVE (A) NEGATIVE    Comment: (NOTE) SARS-CoV-2 target nucleic acids are DETECTED.  The SARS-CoV-2 RNA is generally detectable in upper respiratory specimens during the acute phase of infection. Positive results are indicative of the presence of the identified virus, but do not rule out bacterial infection or co-infection with other pathogens not detected by the test. Clinical correlation with patient history and other diagnostic information is necessary to determine patient infection status. The expected result is Negative.  Fact Sheet for Patients: BloggerCourse.com  Fact Sheet for Healthcare  Providers: SeriousBroker.it  This test is not yet approved or cleared by the [place] FDA and  has been authorized for detection and/or diagnosis of SARS-CoV-2 by FDA under an Emergency Use Authorization (EUA).  This EUA will remain in effect (meaning this test can be used) for the duration of  the COVID-19 declaration under Section 564(b)(1) of the A ct, 21 U.S.C. section 360bbb-3(b)(1), unless the authorization is terminated or revoked sooner.     Influenza A by PCR NEGATIVE NEGATIVE   Influenza B by PCR NEGATIVE NEGATIVE    Comment: (NOTE) The Xpert Xpress SARS-CoV-2/FLU/RSV plus assay is intended as an aid in the diagnosis of influenza from Nasopharyngeal swab specimens and should not be used as a sole basis for treatment. Nasal washings and aspirates are unacceptable for Xpert Xpress SARS-CoV-2/FLU/RSV testing.  Fact Sheet for Patients: BloggerCourse.com  Fact Sheet for Healthcare Providers: SeriousBroker.it  This test is not yet approved or cleared by the [place] FDA and has been authorized for detection and/or diagnosis of SARS-CoV-2 by FDA under an Emergency Use Authorization (EUA). This EUA will remain in effect (meaning this test can be used) for the duration of the COVID-19 declaration under Section 564(b)(1) of the Act, 21 U.S.C. section 360bbb-3(b)(1), unless the authorization is terminated or revoked.  Performed at [facility], [address] [place], [place] [address]   HIV Antibody (routine testing w rflx)  Status: None   Collection Time: 09/25/21  8:30 AM  Result Value Ref Range   HIV Screen 4th Generation wRfx Non Reactive Non Reactive    Comment: Performed at [facility], [place] [address], [place], [place] [address]  Renal function panel     Status: Abnormal   Collection Time: 09/25/21  5:40 PM  Result Value Ref Range   Sodium 132 (L) 135 -  145 mmol/L   Potassium 3.7 3.5 - 5.1 mmol/L   Chloride 97 (L) 98 - 111 mmol/L   CO2 26 22 - 32 mmol/L   Glucose, Bld 102 (H) 70 - 99 mg/dL    Comment: Glucose reference range applies only to samples taken after fasting for at least 8 hours.   BUN 6 6 - 20 mg/dL   Creatinine, Ser 0.33 (L) 0.44 - 1.00 mg/dL   Calcium 8.1 (L) 8.9 - 10.3 mg/dL   Phosphorus 3.5 2.5 - 4.6 mg/dL   Albumin 2.9 (L) 3.5 - 5.0 g/dL   GFR, Estimated >60 >60 mL/min    Comment: (NOTE) Calculated using the CKD-EPI Creatinine Equation (2021)    Anion gap 9 5 - 15    Comment: Performed at [facility], [place] [address] [place], [place] [address]  Renal function panel     Status: Abnormal   Collection Time: 09/25/21 11:36 PM  Result Value Ref Range   Sodium 133 (L) 135 - 145 mmol/L   Potassium 3.3 (L) 3.5 - 5.1 mmol/L   Chloride 96 (L) 98 - 111 mmol/L   CO2 27 22 - 32 mmol/L   Glucose, Bld 89 70 - 99 mg/dL    Comment: Glucose reference range applies only to samples taken after fasting for at least 8 hours.   BUN 8 6 - 20 mg/dL   Creatinine, Ser 0.47 0.44 - 1.00 mg/dL   Calcium 8.1 (L) 8.9 - 10.3 mg/dL   Phosphorus 3.4 2.5 - 4.6 mg/dL   Albumin 2.8 (L) 3.5 - 5.0 g/dL   GFR, Estimated >60 >60 mL/min    Comment: (NOTE) Calculated using the CKD-EPI Creatinine Equation (2021)    Anion gap 10 5 - 15    Comment: Performed at [facility], [place] [address], [place], [place] [address]  CBC with Differential/Platelet     Status: Abnormal   Collection Time: 09/26/21  5:58 AM  Result Value Ref Range   WBC 3.4 (L) 4.0 - 10.5 K/uL   RBC 5.08 3.87 - 5.11 MIL/uL   Hemoglobin 15.8 (H) 12.0 - 15.0 g/dL   HCT 45.8 36.0 - 46.0 %   MCV 90.2 80.0 - 100.0 fL   MCH 31.1 26.0 - 34.0 pg   MCHC 34.5 30.0 - 36.0 g/dL   RDW 13.9 11.5 - 15.5 %   Platelets 285 150 - 400 K/uL   nRBC 0.0 0.0 - 0.2 %   Neutrophils Relative % 49 %   Neutro Abs 1.7 1.7 - 7.7 K/uL   Lymphocytes Relative 23 %   Lymphs  Abs 0.8 0.7 - 4.0 K/uL   Monocytes Relative 27 %   Monocytes Absolute 0.9 0.1 - 1.0 K/uL   Eosinophils Relative 0 %   Eosinophils Absolute 0.0 0.0 - 0.5 K/uL   Basophils Relative 0 %   Basophils Absolute 0.0 0.0 - 0.1 K/uL   Immature Granulocytes 1 %   Abs Immature Granulocytes 0.02 0.00 - 0.07 K/uL    Comment: Performed at [facility],  [place] [address] [place], [place] [address]  Comprehensive metabolic panel     Status: Abnormal   Collection Time: 09/26/21  5:58 AM  Result Value Ref Range   Sodium 134 (L) 135 - 145 mmol/L   Potassium 3.2 (L) 3.5 - 5.1 mmol/L   Chloride 99 98 - 111 mmol/L   CO2 28 22 - 32 mmol/L   Glucose, Bld 85 70 - 99 mg/dL    Comment: Glucose reference range applies only to samples taken after fasting for at least 8 hours.   BUN 8 6 - 20 mg/dL   Creatinine, Ser 0.40 (L) 0.44 - 1.00 mg/dL   Calcium 8.0 (L) 8.9 - 10.3 mg/dL   Total Protein 6.4 (L) 6.5 - 8.1 g/dL   Albumin 2.6 (L) 3.5 - 5.0 g/dL   AST 27 15 - 41 U/L   ALT 18 0 - 44 U/L   Alkaline Phosphatase 79 38 - 126 U/L   Total Bilirubin 0.4 0.3 - 1.2 mg/dL   GFR, Estimated >60 >60 mL/min    Comment: (NOTE) Calculated using the CKD-EPI Creatinine Equation (2021)    Anion gap 7 5 - 15    Comment: Performed at [facility], [place] [address] [place], [place] [address]  C-reactive protein     Status: None   Collection Time: 09/26/21  5:58 AM  Result Value Ref Range   CRP 0.8 <1.0 mg/dL    Comment: Performed at [facility], [place] [address], [place], [place] [address]  D-dimer, quantitative     Status: Abnormal   Collection Time: 09/26/21  5:58 AM  Result Value Ref Range   D-Dimer, Quant 1.20 (H) 0.00 - 0.50 ug/mL-FEU    Comment: (NOTE) At the manufacturer cut-off value of 0.5 g/mL FEU, this assay has a negative predictive value of 95-100%.This assay is intended for use in conjunction with a clinical pretest probability (PTP) assessment model to exclude  pulmonary embolism (PE) and deep venous thrombosis (DVT) in outpatients suspected of PE or DVT. Results should be correlated with clinical presentation. Performed at [facility], [place] [address], [place], [place] [address]   Ferritin     Status: None   Collection Time: 09/26/21  5:58 AM  Result Value Ref Range   Ferritin 21 11 - 307 ng/mL    Comment: Performed at [facility], [place] [address] [place],  [address]     Allergies: No Known Allergies  Current Medications:  Prior to Admission medications   Medication Sig Start Date End Date Taking? Authorizing Provider  albuterol (VENTOLIN HFA) 108 (90 Base) MCG/ACT inhaler Inhale 2 puffs into the lungs every 4 (four) hours as needed for wheezing or shortness of breath.  01/12/20  Yes [provider]  ALPRAZolam Duanne Moron) 0.5 MG tablet Take 0.5 mg by mouth 4 (four) times daily as needed for anxiety or sleep. 02/08/20  Yes [provider]  ARIPiprazole (ABILIFY) 2 MG tablet Take 2 mg by mouth daily. 07/28/21  Yes [provider]  Aspirin-Acetaminophen-Caffeine (GOODY HEADACHE PO) Take 1 packet by mouth daily as needed (headache/pain).   Yes [provider]  Budeson-Glycopyrrol-Formoterol (BREZTRI AEROSPHERE) 160-9-4.8 MCG/ACT AERO Inhale 2 puffs into the lungs 2 (two) times daily.   Yes [provider]  buPROPion (WELLBUTRIN XL) 300 MG 24 hr tablet Take 300 mg by mouth daily. 09/02/21  Yes [provider]  DULoxetine (CYMBALTA) 60 MG capsule Take 60 mg by mouth 2 (two)  times daily. 01/17/20  Yes [provider]  mirtazapine (REMERON) 15 MG tablet Take 15 mg by mouth at bedtime. 09/02/21  Yes [provider]  Multiple Vitamin (MULTIVITAMIN WITH MINERALS) TABS tablet Take 1 tablet by mouth daily.   Yes [provider]  oxyCODONE (OXYCONTIN) 20 mg 12 hr tablet Take 20 mg by mouth every 12 (twelve) hours.  10/30/19  Yes [provider]  Oxycodone HCl 10 MG TABS Take 10 mg by mouth 2 (two) times daily. 08/09/15  Yes [provider]  pregabalin (LYRICA) 150 MG capsule Take 150 mg by mouth 3 (three) times daily. 09/11/21  Yes [provider]  tiZANidine (ZANAFLEX) 4 MG tablet Take 4 mg by mouth 3 (three) times daily. 03/02/13  Yes [provider]  traZODone (DESYREL) 50 MG tablet Take 50 mg by mouth at bedtime as needed for sleep. 09/02/21  Yes [provider]  nicotine (NICODERM CQ - DOSED IN MG/24 HOURS) 21 mg/24hr patch Place 21 mg onto the skin daily.  Patient not taking: Reported on 09/25/2021 01/12/20   [provider]    Social History:    reports that she has quit smoking. Her smoking use included cigarettes. She has quit using smokeless tobacco. No history on file for alcohol use and drug use.   Family History:    History reviewed. No pertinent family history.  Psychiatric Specialty Exam: Physical Exam  Review of Systems  Respiratory:  Positive for shortness of breath.   Psychiatric/Behavioral:  The patient is nervous/anxious.    Blood pressure 118/79, pulse 93, temperature 98 F (36.7 C), temperature source Oral, resp. rate 18, height 5\' 4"  (1.626 m), weight 39.9 kg, SpO2 94 %.Body mass index is 15.1 kg/m.  General Appearance:  lying on bed. On nasal oxygen. Looks  thin and under weight  Eye Contact:  Fair  Speech:  Slow  Volume:  Decreased  Mood:  Anxious  Affect:  Congruent  Thought Process:  Descriptions of Associations: Intact  Orientation:  Full (Time, Place, and Person)  Thought Content:  Rumination  Suicidal Thoughts:  No  Homicidal Thoughts:  No  Memory:  Immediate;   Fair Recent;   Fair Remote;   Fair  Judgement:  Intact  Insight:  Present  Psychomotor Activity:  Decreased  Concentration:  Concentration: Fair and Attention Span: Fair  Recall:  [employer] of Knowledge:  Fair  Language:  Fair  Akathisia:  No  Handed:  Right  AIMS  (if indicated):     Assets:  Communication Skills Desire for Improvement Housing  ADL's:    Cognition:  fair, alert and oriented  Sleep:  fair          Assessment: Jareth is a 60 year old Caucasian female who was admitted because of confusion and found to be hypoxic.  She is taking multiple psychotropic medication.  Her confusion is slowly and gradually getting better.  Recommendations; I reviewed current medication.  She is not taking trazodone and mirtazapine at home which was given by her provider for sleep but that did not help her.  She also given Wellbutrin for cigarette smoking and she is not sure if that helps.  Her trazodone, mirtazapine and Wellbutrin can be discontinued. Continue Cymbalta 120 mg daily, Abilify 2 mg daily and can be adjusted to 5 mg if needed to target her anxiety.  Patient is also taking benzodiazepine from her outpatient psychiatry to help her anxiety.  That can also be continued if not  contraindicated. Patient does not need inpatient treatment and does not meet criteria for psychiatric hospitalization. Psychiatry consultation services will sign off the patient however if symptoms started to get worse then please reconsult psychiatry.  Thank you for your consult and given opportunity for patient care.     Berniece Andreas MD Psychiatry

## 2021-09-26 NOTE — Progress Notes (Addendum)
PROGRESS NOTE  [name]:[id number] DOB: [birth year] DOA: 09/24/2021 PCP: Karle Plumber, MD  HPI/Recap of past 24 hours: Jennifer Mcdaniel is a 60 y.o. female with medical history significant of COPD, ongoing tobacco use disorder, chronic anxiety/depression, chronic pain syndrome who presented from home with complaints of dyspnea, associated with confusion.  She was brought in by her husband due to concern for confusion.  Patient's husband wanted to have psych evaluation.  However, in the emergency room the patient was found to be hypoxic with a positive COVID-19 screening test.  Started on antiviral IV remdesivir and IV steroids, Solu-Medrol, along with bronchodilators, and pulmonary toilet.  09/26/2021: Patient was seen and examined at her bedside.  She is alert and oriented x3.  Endorses persistent productive cough.  No suicidal ideation.  Due to concern for polypharmacy, psychiatry was consulted to assist with the management of her psych medications.  Assessment/Plan: Principal Problem:   Acute exacerbation of chronic obstructive pulmonary disease (COPD) (HCC)  Acute exacerbation of COPD likely secondary to COVID-19 viral infection and ongoing tobacco use Admission x-ray showing stable emphysema, no acute airspace disease. Started on antiviral, IV remdesivir, continue Started on IV Solu-Medrol, continue. Add Z-Pak. Mucinex 1200 mg twice daily x3 days, hypersaline nebs twice daily x3 days. Incentive spirometer, flutter valve. Maintain O2 saturation greater than 90%.  Acute hypoxic respiratory failure secondary to above Not on oxygen supplementation at baseline Continue to maintain a saturation greater than 90% Continue management as stated above. Wean off oxygen supplementation as tolerated. Home oxygen evaluation on 09/26/2021.  Chronic anxiety/depression Resume home antidepressants Appreciate psychiatry's recommendations Management per psychiatry: Continue to hold  off home Wellbutrin, trazodone, and Remeron as recommended by psychiatry.  Tobacco use disorder Ongoing tobacco use Tobacco cessation counseling Nicotine patch as needed  Chronic pain syndrome Resume home regimen Add Narcan as needed Add bowel regimen   Critical care time: 65 minutes.   Code Status: Full code  Family Communication: None at bedside  Disposition Plan: Likely will discharge to home on 09/27/2021.   Consultants: Psychiatry  Procedures: None  Antimicrobials: Azithromycin, 09/26/2021.  DVT prophylaxis: Subcu Lovenox daily.  Status is: Inpatient  Patient requires at least 2 midnights for further evaluation and treatment of present condition.        Objective: Vitals:   09/25/21 1930 09/25/21 2328 09/26/21 0348 09/26/21 0724  BP: (!) 117/59 128/69 118/79   Pulse: 93 89 93   Resp: 20 20 18    Temp: 99.7 F (37.6 C) 99.1 F (37.3 C) 98 F (36.7 C)   TempSrc: Oral Oral Oral   SpO2: 94% 94% 100% 94%  Weight:   39.9 kg   Height:        Intake/Output Summary (Last 24 hours) at 09/26/2021 1355 Last data filed at 09/26/2021 1219 Gross per 24 hour  Intake 1874.57 ml  Output 800 ml  Net 1074.57 ml   Filed Weights   09/25/21 1222 09/26/21 0348  Weight: 53.5 kg 39.9 kg    Exam:  General: 60 y.o. year-old female well developed well nourished in no acute distress.  Alert and oriented x3. Cardiovascular: Regular rate and rhythm with no rubs or gallops.  No thyromegaly or JVD noted.   Respiratory: Mild rales at bases with diffuse wheezing noted.  Poor inspiratory effort. Abdomen: Soft nontender nondistended with normal bowel sounds x4 quadrants. Musculoskeletal: No lower extremity edema. 2/4 pulses in all 4 extremities. Skin: No ulcerative lesions noted or rashes, Psychiatry: Mood  is appropriate for condition and setting Neuro: Moves all 4 extremities.  Nonfocal.   Data Reviewed: CBC: Recent Labs  Lab 09/25/21 0123 09/26/21 0558  WBC  6.7 3.4*  NEUTROABS 5.7 1.7  HGB 15.1* 15.8*  HCT 44.2 45.8  MCV 91.7 90.2  PLT 271 285   Basic Metabolic Panel: Recent Labs  Lab 09/25/21 0123 09/25/21 1740 09/25/21 2336 09/26/21 0558  NA 127* 132* 133* 134*  K 4.1 3.7 3.3* 3.2*  CL 90* 97* 96* 99  CO2 29 26 27 28   GLUCOSE 92 102* 89 85  BUN 10 6 8 8   CREATININE 0.48 0.33* 0.47 0.40*  CALCIUM 8.6* 8.1* 8.1* 8.0*  PHOS  --  3.5 3.4  --    GFR: Estimated Creatinine Clearance: 47.7 mL/min (A) (by C-G formula based on SCr of 0.4 mg/dL (L)). Liver Function Tests: Recent Labs  Lab 09/25/21 0123 09/25/21 1740 09/25/21 2336 09/26/21 0558  AST 30  --   --  27  ALT 18  --   --  18  ALKPHOS 96  --   --  79  BILITOT 0.5  --   --  0.4  PROT 7.4  --   --  6.4*  ALBUMIN 3.1* 2.9* 2.8* 2.6*   No results for input(s): LIPASE, AMYLASE in the last 168 hours. No results for input(s): AMMONIA in the last 168 hours. Coagulation Profile: No results for input(s): INR, PROTIME in the last 168 hours. Cardiac Enzymes: No results for input(s): CKTOTAL, CKMB, CKMBINDEX, TROPONINI in the last 168 hours. BNP (last 3 results) No results for input(s): PROBNP in the last 8760 hours. HbA1C: No results for input(s): HGBA1C in the last 72 hours. CBG: No results for input(s): GLUCAP in the last 168 hours. Lipid Profile: No results for input(s): CHOL, HDL, LDLCALC, TRIG, CHOLHDL, LDLDIRECT in the last 72 hours. Thyroid Function Tests: No results for input(s): TSH, T4TOTAL, FREET4, T3FREE, THYROIDAB in the last 72 hours. Anemia Panel: Recent Labs    09/26/21 0558  FERRITIN 21   Urine analysis:    Component Value Date/Time   COLORURINE YELLOW 09/25/2021 0032   APPEARANCEUR HAZY (A) 09/25/2021 0032   LABSPEC 1.024 09/25/2021 0032   PHURINE 5.0 09/25/2021 0032   GLUCOSEU NEGATIVE 09/25/2021 0032   HGBUR NEGATIVE 09/25/2021 0032   BILIRUBINUR NEGATIVE 09/25/2021 0032   KETONESUR 5 (A) 09/25/2021 0032   PROTEINUR 30 (A) 09/25/2021 0032    NITRITE NEGATIVE 09/25/2021 0032   LEUKOCYTESUR NEGATIVE 09/25/2021 0032   Sepsis Labs: @LABRCNTIP (procalcitonin:4,lacticidven:4)  ) Recent Results (from the past 240 hour(s))  Resp Panel by RT-PCR (Flu A&B, Covid) Nasopharyngeal Swab     Status: Abnormal   Collection Time: 09/25/21  1:23 AM   Specimen: Nasopharyngeal Swab; Nasopharyngeal(NP) swabs in vial transport medium  Result Value Ref Range Status   SARS Coronavirus 2 by RT PCR POSITIVE (A) NEGATIVE Final    Comment: (NOTE) SARS-CoV-2 target nucleic acids are DETECTED.  The SARS-CoV-2 RNA is generally detectable in upper respiratory specimens during the acute phase of infection. Positive results are indicative of the presence of the identified virus, but do not rule out bacterial infection or co-infection with other pathogens not detected by the test. Clinical correlation with patient history and other diagnostic information is necessary to determine patient infection status. The expected result is Negative.  Fact Sheet for Patients: 09/27/2021  Fact Sheet for Healthcare Providers:  This test is not yet approved or cleared by the 09/27/21 FDA  and  has been authorized for detection and/or diagnosis of SARS-CoV-2 by FDA under an Emergency Use Authorization (EUA).  This EUA will remain in effect (meaning this test can be used) for the duration of  the COVID-19 declaration under Section 564(b)(1) of the A ct, 21 U.S.C. section 360bbb-3(b)(1), unless the authorization is terminated or revoked sooner.     Influenza A by PCR NEGATIVE NEGATIVE Final   Influenza B by PCR NEGATIVE NEGATIVE Final    Comment: (NOTE) The Xpert Xpress SARS-CoV-2/FLU/RSV plus assay is intended as an aid in the diagnosis of influenza from Nasopharyngeal swab specimens and should not be used as a sole basis for treatment. Nasal washings and aspirates are unacceptable  for Xpert Xpress SARS-CoV-2/FLU/RSV testing.  Fact Sheet for Patients: BloggerCourse.com  Fact Sheet for Healthcare Providers: SeriousBroker.it  This test is not yet approved or cleared by the [place] FDA and has been authorized for detection and/or diagnosis of SARS-CoV-2 by FDA under an Emergency Use Authorization (EUA). This EUA will remain in effect (meaning this test can be used) for the duration of the COVID-19 declaration under Section 564(b)(1) of the Act, 21 U.S.C. section 360bbb-3(b)(1), unless the authorization is terminated or revoked.  Performed at [facility], [address] [place], [place] [address]       Studies: No results found.  Scheduled Meds:  ARIPiprazole  2 mg Oral Daily   vitamin C  500 mg Oral Daily   docusate sodium  100 mg Oral BID   DULoxetine  60 mg Oral BID   enoxaparin (LOVENOX) injection  40 mg Subcutaneous Q24H   folic acid  1 mg Oral Daily   ipratropium-albuterol  3 mL Nebulization TID   methylPREDNISolone (SOLU-MEDROL) injection  0.5 mg/kg Intravenous Q12H   Followed by   Melene Muller ON 09/28/2021] predniSONE  50 mg Oral Daily   multivitamin with minerals  1 tablet Oral Daily   nicotine  21 mg Transdermal Daily   oxyCODONE  20 mg Oral Q12H   potassium chloride  40 mEq Oral BID   pregabalin  150 mg Oral TID   thiamine  100 mg Oral Daily   tiZANidine  4 mg Oral TID   zinc sulfate  220 mg Oral Daily    Continuous Infusions:  sodium chloride 50 mL/hr at 09/26/21 3875   remdesivir 100 mg in NS 100 mL 100 mg (09/26/21 0901)     LOS: 1 day     Darlin Drop, MD [facility] Pager [phone]  If 7PM-7AM, please contact night-coverage [email or web address] Password [email or web address] 09/26/2021, 1:55 PM

## 2021-09-27 LAB — COMPREHENSIVE METABOLIC PANEL
ALT: 23 U/L (ref 0–44)
AST: 42 U/L — ABNORMAL HIGH (ref 15–41)
Albumin: 3 g/dL — ABNORMAL LOW (ref 3.5–5.0)
Alkaline Phosphatase: 81 U/L (ref 38–126)
Anion gap: 5 (ref 5–15)
BUN: 9 mg/dL (ref 6–20)
CO2: 30 mmol/L (ref 22–32)
Calcium: 8.4 mg/dL — ABNORMAL LOW (ref 8.9–10.3)
Chloride: 99 mmol/L (ref 98–111)
Creatinine, Ser: 0.47 mg/dL (ref 0.44–1.00)
GFR, Estimated: >60 mL/min (ref >60)
Glucose, Bld: 92 mg/dL (ref 70–99)
Potassium: 4.2 mmol/L (ref 3.5–5.1)
Sodium: 134 mmol/L — ABNORMAL LOW (ref 135–145)
Total Bilirubin: 0.4 mg/dL (ref 0.3–1.2)
Total Protein: 7.2 g/dL (ref 6.5–8.1)

## 2021-09-27 LAB — CBC WITH DIFFERENTIAL/PLATELET
Abs Immature Granulocytes: 0.05 10*3/uL (ref 0.00–0.07)
Basophils Absolute: 0 10*3/uL (ref 0.0–0.1)
Basophils Relative: 0 %
Eosinophils Absolute: 0 10*3/uL (ref 0.0–0.5)
Eosinophils Relative: 0 %
HCT: 52.2 % — ABNORMAL HIGH (ref 36.0–46.0)
Hemoglobin: 17.4 g/dL — ABNORMAL HIGH (ref 12.0–15.0)
Immature Granulocytes: 1 %
Lymphocytes Relative: 11 %
Lymphs Abs: 0.8 10*3/uL (ref 0.7–4.0)
MCH: 30.9 pg (ref 26.0–34.0)
MCHC: 33.3 g/dL (ref 30.0–36.0)
MCV: 92.7 fL (ref 80.0–100.0)
Monocytes Absolute: 0.6 10*3/uL (ref 0.1–1.0)
Monocytes Relative: 8 %
Neutro Abs: 5.4 10*3/uL (ref 1.7–7.7)
Neutrophils Relative %: 80 %
Platelets: 307 10*3/uL (ref 150–400)
RBC: 5.63 MIL/uL — ABNORMAL HIGH (ref 3.87–5.11)
RDW: 14.4 % (ref 11.5–15.5)
WBC: 6.8 10*3/uL (ref 4.0–10.5)
nRBC: 0 % (ref 0.0–0.2)

## 2021-09-27 LAB — FERRITIN: Ferritin: 24 ng/mL (ref 11–307)

## 2021-09-27 LAB — C-REACTIVE PROTEIN: CRP: 0.9 mg/dL (ref <1.0)

## 2021-09-27 LAB — D-DIMER, QUANTITATIVE: D-Dimer, Quant: 1.33 ug/mL-FEU — ABNORMAL HIGH (ref 0.00–0.50)

## 2021-09-27 MED ORDER — LORAZEPAM 1 MG PO TABS
1.0000 mg | ORAL_TABLET | ORAL | Status: DC | PRN
  Administered 2021-09-27 – 2021-09-29 (×4): 1 mg via ORAL
  Filled 2021-09-27 (×4): qty 1

## 2021-09-27 MED ORDER — MOLNUPIRAVIR EUA 200MG CAPSULE: 4.0000 | ORAL_CAPSULE | Freq: Two times a day (BID) | ORAL | Status: DC

## 2021-09-27 MED ORDER — SACCHAROMYCES BOULARDII 250 MG PO CAPS
250.0000 mg | ORAL_CAPSULE | Freq: Two times a day (BID) | ORAL | Status: DC
  Administered 2021-09-27 – 2021-09-29 (×5): 250 mg via ORAL
  Filled 2021-09-27 (×5): qty 1

## 2021-09-27 MED ORDER — LORAZEPAM 2 MG/ML IJ SOLN: 1.0000 mg | INTRAMUSCULAR | Status: DC | PRN

## 2021-09-27 MED ORDER — VANCOMYCIN HCL 125 MG PO CAPS
125.0000 mg | ORAL_CAPSULE | Freq: Every day | ORAL | Status: DC
  Administered 2021-09-27 – 2021-09-29 (×3): 125 mg via ORAL
  Filled 2021-09-27 (×3): qty 1

## 2021-09-27 MED ORDER — PREDNISONE 20 MG PO TABS
40.0000 mg | ORAL_TABLET | Freq: Every day | ORAL | Status: DC
  Administered 2021-09-27 – 2021-09-29 (×3): 40 mg via ORAL
  Filled 2021-09-27 (×3): qty 2

## 2021-09-27 MED ORDER — IPRATROPIUM-ALBUTEROL 0.5-2.5 (3) MG/3ML IN SOLN: 3.0000 mL | Freq: Four times a day (QID) | RESPIRATORY_TRACT | Status: DC | PRN

## 2021-09-27 NOTE — Progress Notes (Signed)
SATURATION QUALIFICATIONS: (This note is used to comply with regulatory documentation for home oxygen)  Patient Saturations on Room Air at Rest = 93%  Patient Saturations on Room Air while Ambulating = 86%  Patient Saturations on 2 Liters of oxygen while Ambulating = 92%  Please briefly explain why patient needs home oxygen: desat while ambulating on room air

## 2021-09-27 NOTE — Progress Notes (Signed)
PROGRESS NOTE  Jennifer Mcdaniel [id number] DOB: 1960-10-02 DOA: 09/24/2021 PCP: Guadlupe Spanish, MD  HPI/Recap of past 24 hours: Jennifer Mcdaniel is a 61 y.o. female with medical history significant of COPD, ongoing tobacco use disorder, chronic anxiety/depression, chronic pain syndrome, C. difficile colitis (02/11/20) who presented from home with complaints of dyspnea, associated with confusion.  She was brought in by her husband due to concern for confusion.  Patient's husband wanted to have psych evaluation.  However, in the emergency room the patient was found to be hypoxic with a positive COVID-19 screening test.  Started on antiviral IV remdesivir and IV steroids, Solu-Medrol, along with bronchodilators, and pulmonary toilet.  Due to concern for polypharmacy, psychiatry was consulted to assist with the management of her psych medications.  Denies suicidal or homicidal ideation.  09/27/2021: Patient was seen and examined at bedside.  Mild diffuse wheezing and mild rales at bases noted on auscultation.  Persistent productive cough.  Assessment/Plan: Principal Problem:   Acute exacerbation of chronic obstructive pulmonary disease (COPD) (HCC)  Acute exacerbation of COPD likely secondary to COVID-19 viral infection and ongoing tobacco use Admission x-ray showing stable emphysema, no acute airspace disease. Started on antiviral, IV remdesivir, continue Started on IV Solu-Medrol, continue. Continue Z-Pak. Mucinex 1200 mg twice daily x3 days, hypersaline nebs twice daily x3 days. Incentive spirometer, flutter valve. Maintain O2 saturation greater than 90%.  Acute hypoxic respiratory failure secondary to above Not on oxygen supplementation at baseline Continue to maintain a saturation greater than 90% Continue management as stated above. Wean off oxygen supplementation as tolerated. Failed home oxygen evaluation on 09/27/2021.  Chronic anxiety/depression Continue home  antidepressants Appreciate psychiatry's recommendations Management per psychiatry: Continue to hold off home Wellbutrin, trazodone, and Remeron as recommended by psychiatry.  History of C. difficile colitis, diagnosed 02/11/20 Start p.o. vancomycin while on other antibiotics and continue for additional 5 days after stopping other antibiotic. Start Florastor twice daily She denies diarrhea or abdominal cramps. Afebrile with no leukocytosis  Tobacco use disorder Ongoing tobacco use Tobacco cessation counseling Nicotine patch as needed  Chronic pain syndrome Continue home regimen Add Narcan as needed Add bowel regimen  Alcohol use disorder with concern for alcohol withdrawal Endorses, she drinks about 4 beers per day, last alcohol intake was 3 days ago CIWA protocol Multivitamin, folic acid supplement, thiamine supplement.   Critical care time: 65 minutes.   Code Status: Full code  Family Communication: None at bedside  Disposition Plan: Likely will discharge to home on 09/28/2021.   Consultants: Psychiatry  Procedures: None  Antimicrobials: Azithromycin, 09/26/2021. P.o. vancomycin 09/27/2021  DVT prophylaxis: Subcu Lovenox daily.  Status is: Inpatient  Patient requires at least 2 midnights for further evaluation and treatment of present condition.        Objective: Vitals:   09/27/21 1013 09/27/21 1016 09/27/21 1020 09/27/21 1536  BP:    121/80  Pulse:    89  Resp:    19  Temp:    98.5 F (36.9 C)  TempSrc:    Oral  SpO2: 95% (!) 86% 92% 97%  Weight:      Height:        Intake/Output Summary (Last 24 hours) at 09/27/2021 1603 Last data filed at 09/27/2021 0312 Gross per 24 hour  Intake 553.02 ml  Output --  Net 553.02 ml   Filed Weights   09/25/21 1222 09/26/21 0348 09/27/21 0500  Weight: 53.5 kg 39.9 kg 39.1 kg    Exam:  General: 61 y.o. year-old female frail-appearing no acute acute distress.  She is alert and oriented x3.   Cardiovascular: Regular rate and rhythm no rubs or gallops. Respiratory: Mild diffuse wheezing and rales at bases.  Good respiratory effort.   Abdomen: Soft nontender bowel sounds present.   Musculoskeletal: No lower extremity edema bilaterally.   Skin: No ulcerative lesions noted.   Psychiatry: Mood is appropriate for condition and setting. Neuro: Nonfocal moves all 4 extremities.   Data Reviewed: CBC: Recent Labs  Lab 09/25/21 0123 09/26/21 0558 09/27/21 0601  WBC 6.7 3.4* 6.8  NEUTROABS 5.7 1.7 5.4  HGB 15.1* 15.8* 17.4*  HCT 44.2 45.8 52.2*  MCV 91.7 90.2 92.7  PLT 271 285 [id number]   Basic Metabolic Panel: Recent Labs  Lab 09/25/21 0123 09/25/21 1740 09/25/21 2336 09/26/21 0558 09/27/21 0601  NA 127* 132* 133* 134* 134*  K 4.1 3.7 3.3* 3.2* 4.2  CL 90* 97* 96* 99 99  CO2 29 26 27 28 30   GLUCOSE 92 102* 89 85 92  BUN 10 6 8 8 9   CREATININE 0.48 0.33* 0.47 0.40* 0.47  CALCIUM 8.6* 8.1* 8.1* 8.0* 8.4*  PHOS  --  3.5 3.4  --   --    GFR: Estimated Creatinine Clearance: 46.7 mL/min (by C-G formula based on SCr of 0.47 mg/dL). Liver Function Tests: Recent Labs  Lab 09/25/21 0123 09/25/21 1740 09/25/21 2336 09/26/21 0558 09/27/21 0601  AST 30  --   --  27 42*  ALT 18  --   --  18 23  ALKPHOS 96  --   --  79 81  BILITOT 0.5  --   --  0.4 0.4  PROT 7.4  --   --  6.4* 7.2  ALBUMIN 3.1* 2.9* 2.8* 2.6* 3.0*   No results for input(s): LIPASE, AMYLASE in the last 168 hours. No results for input(s): AMMONIA in the last 168 hours. Coagulation Profile: No results for input(s): INR, PROTIME in the last 168 hours. Cardiac Enzymes: No results for input(s): CKTOTAL, CKMB, CKMBINDEX, TROPONINI in the last 168 hours. BNP (last 3 results) No results for input(s): PROBNP in the last 8760 hours. HbA1C: No results for input(s): HGBA1C in the last 72 hours. CBG: No results for input(s): GLUCAP in the last 168 hours. Lipid Profile: No results for input(s): CHOL, HDL,  LDLCALC, TRIG, CHOLHDL, LDLDIRECT in the last 72 hours. Thyroid Function Tests: No results for input(s): TSH, T4TOTAL, FREET4, T3FREE, THYROIDAB in the last 72 hours. Anemia Panel: Recent Labs    09/26/21 0558 09/27/21 0601  FERRITIN 21 24   Urine analysis:    Component Value Date/Time   COLORURINE YELLOW 09/25/2021 0032   APPEARANCEUR HAZY (A) 09/25/2021 0032   LABSPEC 1.024 09/25/2021 0032   PHURINE 5.0 09/25/2021 0032   GLUCOSEU NEGATIVE 09/25/2021 0032   HGBUR NEGATIVE 09/25/2021 0032   BILIRUBINUR NEGATIVE 09/25/2021 0032   KETONESUR 5 (A) 09/25/2021 0032   PROTEINUR 30 (A) 09/25/2021 0032   NITRITE NEGATIVE 09/25/2021 0032   LEUKOCYTESUR NEGATIVE 09/25/2021 0032   Sepsis Labs: @LABRCNTIP (procalcitonin:4,lacticidven:4)  ) Recent Results (from the past 240 hour(s))  Resp Panel by RT-PCR (Flu A&B, Covid) Nasopharyngeal Swab     Status: Abnormal   Collection Time: 09/25/21  1:23 AM   Specimen: Nasopharyngeal Swab; Nasopharyngeal(NP) swabs in vial transport medium  Result Value Ref Range Status   SARS Coronavirus 2 by RT PCR POSITIVE (A) NEGATIVE Final    Comment: (NOTE) SARS-CoV-2 target nucleic acids  are DETECTED.  The SARS-CoV-2 RNA is generally detectable in upper respiratory specimens during the acute phase of infection. Positive results are indicative of the presence of the identified virus, but do not rule out bacterial infection or co-infection with other pathogens not detected by the test. Clinical correlation with patient history and other diagnostic information is necessary to determine patient infection status. The expected result is Negative.  Fact Sheet for Patients: EntrepreneurPulse.com.au  Fact Sheet for Healthcare Providers: IncredibleEmployment.be  This test is not yet approved or cleared by the [place] FDA and  has been authorized for detection and/or diagnosis of SARS-CoV-2 by FDA under an Emergency  Use Authorization (EUA).  This EUA will remain in effect (meaning this test can be used) for the duration of  the COVID-19 declaration under Section 564(b)(1) of the A ct, 21 U.S.C. section 360bbb-3(b)(1), unless the authorization is terminated or revoked sooner.     Influenza A by PCR NEGATIVE NEGATIVE Final   Influenza B by PCR NEGATIVE NEGATIVE Final    Comment: (NOTE) The Xpert Xpress SARS-CoV-2/FLU/RSV plus assay is intended as an aid in the diagnosis of influenza from Nasopharyngeal swab specimens and should not be used as a sole basis for treatment. Nasal washings and aspirates are unacceptable for Xpert Xpress SARS-CoV-2/FLU/RSV testing.  Fact Sheet for Patients: EntrepreneurPulse.com.au  Fact Sheet for Healthcare Providers: IncredibleEmployment.be  This test is not yet approved or cleared by the [place] FDA and has been authorized for detection and/or diagnosis of SARS-CoV-2 by FDA under an Emergency Use Authorization (EUA). This EUA will remain in effect (meaning this test can be used) for the duration of the COVID-19 declaration under Section 564(b)(1) of the Act, 21 U.S.C. section 360bbb-3(b)(1), unless the authorization is terminated or revoked.  Performed at [facility], [place] [address] [place], [place] [address]       Studies: No results found.  Scheduled Meds:  ARIPiprazole  2 mg Oral Daily   vitamin C  500 mg Oral Daily   azithromycin  250 mg Oral Daily   docusate sodium  100 mg Oral BID   DULoxetine  60 mg Oral BID   enoxaparin (LOVENOX) injection  40 mg Subcutaneous [id number]   folic acid  1 mg Oral Daily   multivitamin with minerals  1 tablet Oral Daily   nicotine  21 mg Transdermal Daily   oxyCODONE  20 mg Oral Q12H   predniSONE  40 mg Oral Q breakfast   pregabalin  150 mg Oral TID   saccharomyces boulardii  250 mg Oral BID   thiamine  100 mg Oral Daily   tiZANidine  4 mg Oral TID    vancomycin  125 mg Oral Daily   zinc sulfate  220 mg Oral Daily    Continuous Infusions:     LOS: 2 days     Kayleen Memos, MD [facility] Pager [phone]  If 7PM-7AM, please contact night-coverage [email or web address] Password [facility] 09/27/2021, 4:03 PM

## 2021-09-28 LAB — CBC WITH DIFFERENTIAL/PLATELET
Abs Immature Granulocytes: 0.03 10*3/uL (ref 0.00–0.07)
Basophils Absolute: 0 10*3/uL (ref 0.0–0.1)
Basophils Relative: 1 %
Eosinophils Absolute: 0 10*3/uL (ref 0.0–0.5)
Eosinophils Relative: 0 %
HCT: 47.4 % — ABNORMAL HIGH (ref 36.0–46.0)
Hemoglobin: 15.8 g/dL — ABNORMAL HIGH (ref 12.0–15.0)
Immature Granulocytes: 1 %
Lymphocytes Relative: 38 %
Lymphs Abs: 2.2 10*3/uL (ref 0.7–4.0)
MCH: 30.3 pg (ref 26.0–34.0)
MCHC: 33.3 g/dL (ref 30.0–36.0)
MCV: 90.8 fL (ref 80.0–100.0)
Monocytes Absolute: 0.6 10*3/uL (ref 0.1–1.0)
Monocytes Relative: 11 %
Neutro Abs: 2.8 10*3/uL (ref 1.7–7.7)
Neutrophils Relative %: 49 %
Platelets: 278 10*3/uL (ref 150–400)
RBC: 5.22 MIL/uL — ABNORMAL HIGH (ref 3.87–5.11)
RDW: 14.2 % (ref 11.5–15.5)
WBC: 5.6 10*3/uL (ref 4.0–10.5)
nRBC: 0 % (ref 0.0–0.2)

## 2021-09-28 LAB — COMPREHENSIVE METABOLIC PANEL
ALT: 19 U/L (ref 0–44)
AST: 33 U/L (ref 15–41)
Albumin: 2.6 g/dL — ABNORMAL LOW (ref 3.5–5.0)
Alkaline Phosphatase: 68 U/L (ref 38–126)
Anion gap: 6 (ref 5–15)
BUN: 11 mg/dL (ref 6–20)
CO2: 29 mmol/L (ref 22–32)
Calcium: 8.4 mg/dL — ABNORMAL LOW (ref 8.9–10.3)
Chloride: 98 mmol/L (ref 98–111)
Creatinine, Ser: 0.53 mg/dL (ref 0.44–1.00)
GFR, Estimated: >60 mL/min (ref >60)
Glucose, Bld: 88 mg/dL (ref 70–99)
Potassium: 3.7 mmol/L (ref 3.5–5.1)
Sodium: 133 mmol/L — ABNORMAL LOW (ref 135–145)
Total Bilirubin: 0.4 mg/dL (ref 0.3–1.2)
Total Protein: 6.5 g/dL (ref 6.5–8.1)

## 2021-09-28 LAB — LACTIC ACID, PLASMA: Lactic Acid, Venous: 1.5 mmol/L (ref 0.5–1.9)

## 2021-09-28 LAB — PROCALCITONIN: Procalcitonin: <0.1 ng/mL

## 2021-09-28 LAB — C-REACTIVE PROTEIN: CRP: 0.6 mg/dL (ref <1.0)

## 2021-09-28 LAB — FERRITIN: Ferritin: 18 ng/mL (ref 11–307)

## 2021-09-28 LAB — D-DIMER, QUANTITATIVE: D-Dimer, Quant: 0.96 ug/mL-FEU — ABNORMAL HIGH (ref 0.00–0.50)

## 2021-09-28 MED ORDER — MOLNUPIRAVIR EUA 200MG CAPSULE
4.0000 | ORAL_CAPSULE | Freq: Two times a day (BID) | ORAL | Status: DC
  Administered 2021-09-28 – 2021-09-29 (×3): 800 mg via ORAL
  Filled 2021-09-28 (×2): qty 4

## 2021-09-28 MED ORDER — FUROSEMIDE 10 MG/ML IJ SOLN
20.0000 mg | INTRAMUSCULAR | Status: AC
  Administered 2021-09-28: 20 mg via INTRAVENOUS
  Filled 2021-09-28: qty 2

## 2021-09-28 NOTE — Evaluation (Signed)
Physical Therapy Evaluation Patient Details Name: Jennifer Mcdaniel MRN: [id number] DOB: August 21, 1961 Today's Date: 09/28/2021  History of Present Illness  61yo female who presented on 12/29 c/o dyspnea. Initially brought to the ED by her husband for a psych eval but found to be hypoxic and tested + for Covid. PMH anxiety, depression, ephysema, back surgery  Clinical Impression   Patient received in bed, RN present and attending. Able to transfer on an independent basis no device, did require close S for safety and balance when walking laps in her room, tends to reach out for walls and bedframe but refused HHA from PT. SPO2 90% at lowest on room air, HR 110. Left in bed with all needs met, RN aware of pt status. While she might benefit from PT f/u she adamantly declines- we will continue to follow while she remains in house.        Recommendations for follow up therapy are one component of a multi-disciplinary discharge planning process, led by the attending physician.  Recommendations may be updated based on patient status, additional functional criteria and insurance authorization.  Follow Up Recommendations No PT follow up (refusing any PT f/u, would benefit from HHPT)    Assistance Recommended at Discharge Intermittent Supervision/Assistance  Functional Status Assessment Patient has not had a recent decline in their functional status  Equipment Recommendations  Rolling walker (2 wheels)    Recommendations for Other Services       Precautions / Restrictions Precautions Precautions: Fall;Other (comment) Precaution Comments: watch sats/HR, Covid + Restrictions Weight Bearing Restrictions: No      Mobility  Bed Mobility Overal bed mobility: Modified Independent             General bed mobility comments: HOB elevated    Transfers Overall transfer level: Independent Equipment used: None               General transfer comment: fully independnet for sit to stand and  stand pivot transfers    Ambulation/Gait Ambulation/Gait assistance: Supervision Gait Distance (Feet): 60 Feet Assistive device: None Gait Pattern/deviations: Drifts right/left;Narrow base of support;Step-through pattern;Trunk flexed Gait velocity: decreased     General Gait Details: slow and mildly unsteady, tends to reach for walls and furniture but no formal LOB; refused trying HHA.  Stairs            Wheelchair Mobility    Modified Rankin (Stroke Patients Only)       Balance Overall balance assessment: Mild deficits observed, not formally tested                                           Pertinent Vitals/Pain Pain Assessment: 0-10 Pain Score: 6  Pain Location: back pain, chronic Pain Descriptors / Indicators: Aching;Discomfort Pain Intervention(s): Limited activity within patient's tolerance;Monitored during session;Premedicated before session    Home Living Family/patient expects to be discharged to:: Private residence Living Arrangements: Spouse/significant other Available Help at Discharge: Family;Available 24 hours/day Type of Home: House Home Access: Level entry     Alternate Level Stairs-Number of Steps: 8+8 steps split foyer Home Layout: Two level Home Equipment: Shower seat;None Additional Comments: no need for O2 at baseline; drives, manages meds; husband is on disability    Prior Function Prior Level of Function : Independent/Modified Independent  Hand Dominance        Extremity/Trunk Assessment   Upper Extremity Assessment Upper Extremity Assessment: Generalized weakness    Lower Extremity Assessment Lower Extremity Assessment: Generalized weakness    Cervical / Trunk Assessment Cervical / Trunk Assessment: Kyphotic  Communication   Communication: No difficulties  Cognition Arousal/Alertness: Awake/alert Behavior During Therapy: Flat affect;Impulsive;WFL for tasks  assessed/performed Overall Cognitive Status: No family/caregiver present to determine baseline cognitive functioning                                 General Comments: polite and cooperative but mildly impulsive (question if this is baseline), followed command well; did seem to have trouble with multistep commands and functional tasks such as managing O2 line, unsure what base line status is        General Comments General comments (skin integrity, edema, etc.): VSS 90% lowest on RA, HR 110    Exercises     Assessment/Plan    PT Assessment Patient needs continued PT services  PT Problem List Decreased strength;Decreased knowledge of use of DME;Decreased activity tolerance;Decreased safety awareness;Decreased balance;Decreased mobility;Cardiopulmonary status limiting activity;Decreased coordination       PT Treatment Interventions DME instruction;Balance training;Gait training;Stair training;Functional mobility training;Patient/family education;Therapeutic activities;Therapeutic exercise    PT Goals (Current goals can be found in the Care Plan section)  Acute Rehab PT Goals Patient Stated Goal: go home today PT Goal Formulation: With patient Time For Goal Achievement: 10/12/21 Potential to Achieve Goals: Fair    Frequency Min 3X/week   Barriers to discharge        Co-evaluation               AM-PAC PT "6 Clicks" Mobility  Outcome Measure Help needed turning from your back to your side while in a flat bed without using bedrails?: None Help needed moving from lying on your back to sitting on the side of a flat bed without using bedrails?: None Help needed moving to and from a bed to a chair (including a wheelchair)?: None Help needed standing up from a chair using your arms (e.g., wheelchair or bedside chair)?: None Help needed to walk in hospital room?: A Little Help needed climbing 3-5 steps with a railing? : A Little 6 Click Score: 22    End of  Session Equipment Utilized During Treatment: Oxygen Activity Tolerance: Patient tolerated treatment well Patient left: in bed;with call bell/phone within reach Nurse Communication: Mobility status PT Visit Diagnosis: Unsteadiness on feet (R26.81);Muscle weakness (generalized) (M62.81)    Time: 1010-1049 PT Time Calculation (min) (ACUTE ONLY): 39 min   Charges:   PT Evaluation $PT Eval Moderate Complexity: 1 Mod PT Treatments $Therapeutic Activity: 8-22 mins       Windell Norfolk, DPT, PN2   Supplemental Physical Therapist [place]    Pager [phone] Acute Rehab Office [phone]

## 2021-09-28 NOTE — Evaluation (Signed)
Occupational Therapy Evaluation Patient Details Name: Jennifer Mcdaniel MRN: [id number] DOB: [birth year] Today's Date: 09/28/2021   History of Present Illness 61yo female who presented on 12/29 c/o dyspnea. Initially brought to the ED by her husband for a psych eval but found to be hypoxic and tested + for Covid. PMH anxiety, depression, ephysema, back surgery   Clinical Impression   Chart reviewed, pt greeted in bed agreeable to OT tx session. Pt is alert and oriented x4, however demonstrates flat affect and fair-good safety awareness. Pt reports that she is MOD I-I in all ADL PTA; however she does report difficulty eating due to SOB at home. She reports intermittent assist with IADL PTA. Pt performs all mobility and ADL with SUP;frequent vcs provided for EC and body mechanics. Pt would benefit from OT to address functional deficits and to improve ADL performance with the use of energy conservation techniques. Pt would benefit from use of AE to improve safe ADL completion. Pt is left as received, NAD, all needs met. RN aware of pt status. OT will continue to follow acutely, recommend HHOT after discharge.      Recommendations for follow up therapy are one component of a multi-disciplinary discharge planning process, led by the attending physician.  Recommendations may be updated based on patient status, additional functional criteria and insurance authorization.   Follow Up Recommendations  Home health OT    Assistance Recommended at Discharge Set up Supervision/Assistance  Functional Status Assessment  Patient has had a recent decline in their functional status and demonstrates the ability to make significant improvements in function in a reasonable and predictable amount of time.  Equipment Recommendations  BSC/3in1;Tub/shower seat    Recommendations for Other Services       Precautions / Restrictions Precautions Precautions: Fall Restrictions Weight Bearing Restrictions: No       Mobility Bed Mobility Overal bed mobility: Modified Independent             General bed mobility comments: HOB elevated    Transfers Overall transfer level: Needs assistance                 General transfer comment: SUP for transfers      Balance                                           ADL either performed or assessed with clinical judgement   ADL Overall ADL's : Needs assistance/impaired Eating/Feeding: Set up   Grooming: Wash/dry hands;Wash/dry face;Supervision/safety;Sitting;Standing Grooming Details (indicate cue type and reason): vcs for EC techniques Upper Body Bathing: Supervision/ safety;Standing   Lower Body Bathing: Supervison/ safety;Sit to/from stand Lower Body Bathing Details (indicate cue type and reason): vcs for EC techniques Upper Body Dressing : Minimal assistance   Lower Body Dressing: Minimal assistance   Toilet Transfer: Supervision/safety   Toileting- Clothing Manipulation and Hygiene: Supervision/safety       Functional mobility during ADLs: Supervision/safety General ADL Comments: fatigue with prolonged standing, SPO2 at 93% at 1 L o2 via [place]     Vision Patient Visual Report: No change from baseline       Perception     Praxis      Pertinent Vitals/Pain Pain Assessment: No/denies pain     Hand Dominance     Extremity/Trunk Assessment Upper Extremity Assessment Upper Extremity Assessment: Generalized weakness   Lower Extremity Assessment Lower Extremity  Assessment: Generalized weakness   Cervical / Trunk Assessment Cervical / Trunk Assessment: Kyphotic   Communication Communication Communication: No difficulties   Cognition Arousal/Alertness: Awake/alert Behavior During Therapy: WFL for tasks assessed/performed Overall Cognitive Status: No family/caregiver present to determine baseline cognitive functioning                                 General Comments: alert and  oriented  x4, fair multi step direction following however fair- good safety awareness noted throughout tx session     General Comments  with activity: 93% spo2 on 1L [place], HR up to 116;    Exercises     Shoulder Instructions      Home Living Family/patient expects to be discharged to:: Private residence Living Arrangements: Spouse/significant other Available Help at Discharge: Family;Available 24 hours/day Type of Home: House Home Access: Level entry     Home Layout: Two level Alternate Level Stairs-Number of Steps: 8+8 steps split foyer   Bathroom Shower/Tub: Walk-in shower;Tub/shower unit   Bathroom Toilet: Handicapped height     Home Equipment: Shower seat          Prior Functioning/Environment Prior Level of Function : Independent/Modified Independent             Mobility Comments: I for mobility per pt report ADLs Comments: MOD I for ADL; pt has assist for IADLs such as cooking/cleaning as needed; does not do laundry        OT Problem List: Decreased strength;Decreased cognition;Cardiopulmonary status limiting activity;Decreased activity tolerance;Decreased knowledge of use of DME or AE      OT Treatment/Interventions: Self-care/ADL training;DME and/or AE instruction;Therapeutic activities;Therapeutic exercise;Energy conservation;Patient/family education    OT Goals(Current goals can be found in the care plan section) Acute Rehab OT Goals Patient Stated Goal: to feel better OT Goal Formulation: With patient Time For Goal Achievement: 10/12/21 Potential to Achieve Goals: Good ADL Goals Pt Will Perform Grooming: with modified independence Pt Will Perform Upper Body Bathing: with modified independence Pt Will Perform Lower Body Bathing: with modified independence Additional ADL Goal #1: Will demonstrate energy conservation techniques with no more than 1 vc during ADL task completion for increased safe independent ADL completion  OT Frequency: Min 2X/week    Barriers to D/C:            Co-evaluation              AM-PAC OT "6 Clicks" Daily Activity     Outcome Measure Help from another person eating meals?: None Help from another person taking care of personal grooming?: None Help from another person toileting, which includes using toliet, bedpan, or urinal?: None Help from another person bathing (including washing, rinsing, drying)?: None Help from another person to put on and taking off regular upper body clothing?: A Little Help from another person to put on and taking off regular lower body clothing?: A Little 6 Click Score: 22   End of Session Equipment Utilized During Treatment: Gait belt Nurse Communication: Mobility status  Activity Tolerance: Patient tolerated treatment well Patient left: in bed;with call bell/phone within reach  OT Visit Diagnosis: Unsteadiness on feet (R26.81);Muscle weakness (generalized) (M62.81)                Time: 1423-1440 OT Time Calculation (min): 17 min Charges:  OT General Charges $OT Visit: 1 Visit OT Evaluation $OT Eval Low Complexity: 1 Low OT Treatments $Self Care/Home Management : 8-22  mins  Shanon Payor, OTD OTR/L  09/28/21, 3:01 PM

## 2021-09-28 NOTE — Progress Notes (Signed)
PROGRESS NOTE  Jennifer Mcdaniel [id number] DOB: Jul 26, 1961 DOA: 09/24/2021 PCP: Guadlupe Spanish, MD  HPI/Recap of past 24 hours: Jennifer Mcdaniel is a 61 y.o. female with medical history significant of COPD, ongoing tobacco use disorder, chronic anxiety/depression, chronic pain syndrome, C. difficile colitis (02/11/20) who presented from home with complaints of dyspnea, associated with confusion.  She was brought in by her husband due to concern for confusion.  Patient's husband wanted to have psych evaluation.  However, in the emergency room the patient was found to be hypoxic with a positive COVID-19 screening test.  Started on antiviral IV remdesivir and IV steroids, Solu-Medrol, along with bronchodilators, and pulmonary toilet.  Due to concern for polypharmacy, psychiatry was consulted to assist with the management of her psych medications.  Denies suicidal or homicidal ideation.  09/28/2021: Cough is persistent.  No chest pain.  Intermittent nausea.      Assessment/Plan: Principal Problem:   Acute exacerbation of chronic obstructive pulmonary disease (COPD) (HCC)  Acute exacerbation of COPD likely secondary to COVID-19 viral infection and ongoing tobacco use Admission x-ray showing stable emphysema, no acute airspace disease. Started on antiviral, IV remdesivir, continue Started on IV Solu-Medrol, continue. Continue Z-Pak. Mucinex 1200 mg twice daily x3 days, hypersaline nebs twice daily x3 days. Incentive spirometer, flutter valve. Maintain O2 saturation greater than 90%.  Covid-19 viral infection High risk of complications Ongoing tobacco use, COPD Recent C-diff colitis Received 3 doses of Remdesivir Tmax 100.3 on 09/28/21 Switched to [facility] on 09/28/21 for dc planning due to high risk for complications.  Acute hypoxic respiratory failure secondary to above Not on oxygen supplementation at baseline Continue to maintain a saturation greater than 90% Continue management  as stated above. Wean off oxygen supplementation as tolerated. 1 dose IV lasix 20 mg  Chronic anxiety/depression Continue home antidepressants Appreciate psychiatry's recommendations Management per psychiatry: Continue to hold off home Wellbutrin, trazodone, and Remeron as recommended by psychiatry.  History of C. difficile colitis, diagnosed 02/11/20 Start p.o. vancomycin while on other antibiotics and continue for additional 5 days after stopping other antibiotic. Start Florastor twice daily She denies diarrhea or abdominal cramps. Afebrile with no leukocytosis  Tobacco use disorder Ongoing tobacco use Tobacco cessation counseling Nicotine patch as needed  Chronic pain syndrome Continue home regimen Add Narcan as needed Add bowel regimen  Alcohol use disorder with concern for alcohol withdrawal Endorses, she drinks about 4 beers per day, last alcohol intake was 3 days ago CIWA protocol Multivitamin, folic acid supplement, thiamine supplement.   Critical care time: 65 minutes.   Code Status: Full code  Family Communication: None at bedside  Disposition Plan: Likely will discharge to home on 09/28/2021.   Consultants: Psychiatry  Procedures: None  Antimicrobials: Azithromycin, 09/26/2021. P.o. vancomycin 09/27/2021  DVT prophylaxis: Subcu Lovenox daily.  Status is: Inpatient  Patient requires at least 2 midnights for further evaluation and treatment of present condition.        Objective: Vitals:   09/27/21 2113 09/28/21 0500 09/28/21 0504 09/28/21 1451  BP: 112/62  131/84   Pulse: 81  87   Resp: 18  16   Temp: 97.9 F (36.6 C)  100.3 F (37.9 C)   TempSrc: Oral  Oral   SpO2: 97%  95% 93%  Weight:  39 kg    Height:        Intake/Output Summary (Last 24 hours) at 09/28/2021 1630 Last data filed at 09/27/2021 2300 Gross per 24 hour  Intake 240  ml  Output --  Net 240 ml   Filed Weights   09/26/21 0348 09/27/21 0500 09/28/21 0500  Weight:  39.9 kg 39.1 kg 39 kg    Exam:  General: 61 y.o. year-old female Frail appearing in no acute distress.  A&O  x3 Cardiovascular: RRR no rubs or gallops Respiratory: Mild diffused wheezes with mild rales at bases.  Good inspiratory efforts. Abdomen: Soft NT NBS Musculoskeletal: No LE edema   Skin: No ulcerative lesions Psychiatry: Mood is appropriate Neuro: Non focal moves all 4 extremities   Data Reviewed: CBC: Recent Labs  Lab 09/25/21 0123 09/26/21 0558 09/27/21 0601 09/28/21 0535  WBC 6.7 3.4* 6.8 5.6  NEUTROABS 5.7 1.7 5.4 2.8  HGB 15.1* 15.8* 17.4* 15.8*  HCT 44.2 45.8 52.2* 47.4*  MCV 91.7 90.2 92.7 90.8  PLT 271 285 307 0000000   Basic Metabolic Panel: Recent Labs  Lab 09/25/21 1740 09/25/21 2336 09/26/21 0558 09/27/21 0601 09/28/21 0535  NA 132* 133* 134* 134* 133*  K 3.7 3.3* 3.2* 4.2 3.7  CL 97* 96* 99 99 98  CO2 26 27 28 30 29   GLUCOSE 102* 89 85 92 88  BUN 6 8 8 9 11   CREATININE 0.33* 0.47 0.40* 0.47 0.53  CALCIUM 8.1* 8.1* 8.0* 8.4* 8.4*  PHOS 3.5 3.4  --   --   --    GFR: Estimated Creatinine Clearance: 46.6 mL/min (by C-G formula based on SCr of 0.53 mg/dL). Liver Function Tests: Recent Labs  Lab 09/25/21 0123 09/25/21 1740 09/25/21 2336 09/26/21 0558 09/27/21 0601 09/28/21 0535  AST 30  --   --  27 42* 33  ALT 18  --   --  18 23 19   ALKPHOS 96  --   --  79 81 68  BILITOT 0.5  --   --  0.4 0.4 0.4  PROT 7.4  --   --  6.4* 7.2 6.5  ALBUMIN 3.1* 2.9* 2.8* 2.6* 3.0* 2.6*   No results for input(s): LIPASE, AMYLASE in the last 168 hours. No results for input(s): AMMONIA in the last 168 hours. Coagulation Profile: No results for input(s): INR, PROTIME in the last 168 hours. Cardiac Enzymes: No results for input(s): CKTOTAL, CKMB, CKMBINDEX, TROPONINI in the last 168 hours. BNP (last 3 results) No results for input(s): PROBNP in the last 8760 hours. HbA1C: No results for input(s): HGBA1C in the last 72 hours. CBG: No results for  input(s): GLUCAP in the last 168 hours. Lipid Profile: No results for input(s): CHOL, HDL, LDLCALC, TRIG, CHOLHDL, LDLDIRECT in the last 72 hours. Thyroid Function Tests: No results for input(s): TSH, T4TOTAL, FREET4, T3FREE, THYROIDAB in the last 72 hours. Anemia Panel: Recent Labs    09/27/21 0601 09/28/21 0535  FERRITIN 24 18   Urine analysis:    Component Value Date/Time   COLORURINE YELLOW 09/25/2021 0032   APPEARANCEUR HAZY (A) 09/25/2021 0032   LABSPEC 1.024 09/25/2021 0032   PHURINE 5.0 09/25/2021 0032   GLUCOSEU NEGATIVE 09/25/2021 0032   HGBUR NEGATIVE 09/25/2021 0032   BILIRUBINUR NEGATIVE 09/25/2021 0032   KETONESUR 5 (A) 09/25/2021 0032   PROTEINUR 30 (A) 09/25/2021 0032   NITRITE NEGATIVE 09/25/2021 0032   LEUKOCYTESUR NEGATIVE 09/25/2021 0032   Sepsis Labs: @LABRCNTIP (procalcitonin:4,lacticidven:4)  ) Recent Results (from the past 240 hour(s))  Resp Panel by RT-PCR (Flu A&B, Covid) Nasopharyngeal Swab     Status: Abnormal   Collection Time: 09/25/21  1:23 AM   Specimen: Nasopharyngeal Swab; Nasopharyngeal(NP) swabs in  vial transport medium  Result Value Ref Range Status   SARS Coronavirus 2 by RT PCR POSITIVE (A) NEGATIVE Final    Comment: (NOTE) SARS-CoV-2 target nucleic acids are DETECTED.  The SARS-CoV-2 RNA is generally detectable in upper respiratory specimens during the acute phase of infection. Positive results are indicative of the presence of the identified virus, but do not rule out bacterial infection or co-infection with other pathogens not detected by the test. Clinical correlation with patient history and other diagnostic information is necessary to determine patient infection status. The expected result is Negative.  Fact Sheet for Patients: EntrepreneurPulse.com.au  Fact Sheet for Healthcare Providers: IncredibleEmployment.be  This test is not yet approved or cleared by the [place] FDA and   has been authorized for detection and/or diagnosis of SARS-CoV-2 by FDA under an Emergency Use Authorization (EUA).  This EUA will remain in effect (meaning this test can be used) for the duration of  the COVID-19 declaration under Section 564(b)(1) of the A ct, 21 U.S.C. section 360bbb-3(b)(1), unless the authorization is terminated or revoked sooner.     Influenza A by PCR NEGATIVE NEGATIVE Final   Influenza B by PCR NEGATIVE NEGATIVE Final    Comment: (NOTE) The Xpert Xpress SARS-CoV-2/FLU/RSV plus assay is intended as an aid in the diagnosis of influenza from Nasopharyngeal swab specimens and should not be used as a sole basis for treatment. Nasal washings and aspirates are unacceptable for Xpert Xpress SARS-CoV-2/FLU/RSV testing.  Fact Sheet for Patients: EntrepreneurPulse.com.au  Fact Sheet for Healthcare Providers: IncredibleEmployment.be  This test is not yet approved or cleared by the [place] FDA and has been authorized for detection and/or diagnosis of SARS-CoV-2 by FDA under an Emergency Use Authorization (EUA). This EUA will remain in effect (meaning this test can be used) for the duration of the COVID-19 declaration under Section 564(b)(1) of the Act, 21 U.S.C. section 360bbb-3(b)(1), unless the authorization is terminated or revoked.  Performed at [facility], [place] [address] [place], [place] [address]       Studies: No results found.  Scheduled Meds:  ARIPiprazole  2 mg Oral Daily   vitamin C  500 mg Oral Daily   azithromycin  250 mg Oral Daily   docusate sodium  100 mg Oral BID   DULoxetine  60 mg Oral BID   enoxaparin (LOVENOX) injection  40 mg Subcutaneous [id number]   folic acid  1 mg Oral Daily   molnupiravir EUA  4 capsule Oral BID   multivitamin with minerals  1 tablet Oral Daily   nicotine  21 mg Transdermal Daily   oxyCODONE  20 mg Oral Q12H   predniSONE  40 mg Oral Q breakfast    pregabalin  150 mg Oral TID   saccharomyces boulardii  250 mg Oral BID   thiamine  100 mg Oral Daily   tiZANidine  4 mg Oral TID   vancomycin  125 mg Oral Daily   zinc sulfate  220 mg Oral Daily    Continuous Infusions:     LOS: 3 days     Kayleen Memos, MD [facility] Pager [phone]  If 7PM-7AM, please contact night-coverage [email or web address] Password [facility] 09/28/2021, 4:30 PM

## 2021-09-28 NOTE — Progress Notes (Signed)
Please see in addition to PT note:   SATURATION QUALIFICATIONS: (This note is used to comply with regulatory documentation for home oxygen)   Patient Saturations on Room Air at Rest = 96%   Patient Saturations on Room Air while Ambulating = 90%   Patient Saturations on 2 Liters of oxygen while Ambulating = DNT, sats ok on room air    Please briefly explain why patient needs home oxygen: N/A   Lerry Liner PT, DPT, PN2   Supplemental Physical Therapist [facility]    Pager [phone] Acute Rehab Office [phone]

## 2021-09-29 ENCOUNTER — Other Ambulatory Visit: Payer: Self-pay

## 2021-09-29 LAB — CBC WITH DIFFERENTIAL/PLATELET
Abs Immature Granulocytes: 0.02 10*3/uL (ref 0.00–0.07)
Basophils Absolute: 0 10*3/uL (ref 0.0–0.1)
Basophils Relative: 0 %
Eosinophils Absolute: 0 10*3/uL (ref 0.0–0.5)
Eosinophils Relative: 0 %
HCT: 50.7 % — ABNORMAL HIGH (ref 36.0–46.0)
Hemoglobin: 16.9 g/dL — ABNORMAL HIGH (ref 12.0–15.0)
Immature Granulocytes: 0 %
Lymphocytes Relative: 47 %
Lymphs Abs: 2.3 10*3/uL (ref 0.7–4.0)
MCH: 30.7 pg (ref 26.0–34.0)
MCHC: 33.3 g/dL (ref 30.0–36.0)
MCV: 92 fL (ref 80.0–100.0)
Monocytes Absolute: 0.6 10*3/uL (ref 0.1–1.0)
Monocytes Relative: 12 %
Neutro Abs: 2 10*3/uL (ref 1.7–7.7)
Neutrophils Relative %: 41 %
Platelets: 291 10*3/uL (ref 150–400)
RBC: 5.51 MIL/uL — ABNORMAL HIGH (ref 3.87–5.11)
RDW: 14 % (ref 11.5–15.5)
WBC: 5 10*3/uL (ref 4.0–10.5)
nRBC: 0 % (ref 0.0–0.2)

## 2021-09-29 LAB — COMPREHENSIVE METABOLIC PANEL
ALT: 20 U/L (ref 0–44)
AST: 30 U/L (ref 15–41)
Albumin: 3 g/dL — ABNORMAL LOW (ref 3.5–5.0)
Alkaline Phosphatase: 74 U/L (ref 38–126)
Anion gap: 6 (ref 5–15)
BUN: 10 mg/dL (ref 6–20)
CO2: 33 mmol/L — ABNORMAL HIGH (ref 22–32)
Calcium: 8.8 mg/dL — ABNORMAL LOW (ref 8.9–10.3)
Chloride: 95 mmol/L — ABNORMAL LOW (ref 98–111)
Creatinine, Ser: 0.59 mg/dL (ref 0.44–1.00)
GFR, Estimated: >60 mL/min (ref >60)
Glucose, Bld: 80 mg/dL (ref 70–99)
Potassium: 3.7 mmol/L (ref 3.5–5.1)
Sodium: 134 mmol/L — ABNORMAL LOW (ref 135–145)
Total Bilirubin: 0.5 mg/dL (ref 0.3–1.2)
Total Protein: 7 g/dL (ref 6.5–8.1)

## 2021-09-29 LAB — D-DIMER, QUANTITATIVE: D-Dimer, Quant: 0.74 ug/mL-FEU — ABNORMAL HIGH (ref 0.00–0.50)

## 2021-09-29 LAB — C-REACTIVE PROTEIN: CRP: 0.6 mg/dL (ref <1.0)

## 2021-09-29 LAB — FERRITIN: Ferritin: 23 ng/mL (ref 11–307)

## 2021-09-29 MED ORDER — ASCORBIC ACID 500 MG PO TABS: 500.0000 mg | ORAL_TABLET | Freq: Every day | ORAL | 0 refills | Status: AC

## 2021-09-29 MED ORDER — MOLNUPIRAVIR EUA 200MG CAPSULE: 4.0000 | ORAL_CAPSULE | Freq: Two times a day (BID) | ORAL | 0 refills | Status: AC

## 2021-09-29 MED ORDER — SACCHAROMYCES BOULARDII 250 MG PO CAPS: 250.0000 mg | ORAL_CAPSULE | Freq: Two times a day (BID) | ORAL | 0 refills | Status: AC

## 2021-09-29 MED ORDER — THIAMINE HCL 100 MG PO TABS: 100.0000 mg | ORAL_TABLET | Freq: Every day | ORAL | 0 refills | Status: AC

## 2021-09-29 MED ORDER — ZINC SULFATE 220 (50 ZN) MG PO CAPS: 220.0000 mg | ORAL_CAPSULE | Freq: Every day | ORAL | 0 refills | Status: AC

## 2021-09-29 MED ORDER — VANCOMYCIN HCL 125 MG PO CAPS: 125.0000 mg | ORAL_CAPSULE | Freq: Every day | ORAL | 0 refills | Status: AC

## 2021-09-29 MED ORDER — FOLIC ACID 1 MG PO TABS: 1.0000 mg | ORAL_TABLET | Freq: Every day | ORAL | 0 refills | Status: AC

## 2021-09-29 MED ORDER — NALOXONE HCL 0.4 MG/ML IJ SOLN: 0.4000 mg | INTRAMUSCULAR | 0 refills | Status: AC | PRN

## 2021-09-29 NOTE — Progress Notes (Signed)
Physical Therapy Treatment Patient Details Name: Jennifer Mcdaniel MRN: [id number] DOB: 1960-11-29 Today's Date: 09/29/2021   History of Present Illness 60yo female who presented on 12/29 c/o dyspnea. Initially brought to the ED by her husband for a psych eval but found to be hypoxic and tested + for Covid. PMH anxiety, depression, ephysema, back surgery    PT Comments    Pt admitted with above diagnosis. At baseline, pt is independent.  Today, pt ambulating 100'x2 with rest breaks with min cues/education on breathing techniques.  She did have a decrease in oxygen saturation on RA requiring 2 L to maintain sats with activity.  Min cues for safety . Pt showing gradual progress - continue plan of care.  Marland Kitchen    SATURATION QUALIFICATIONS: (This note is used to comply with regulatory documentation for home oxygen)  Patient Saturations on Room Air at Rest = 89%  Patient Saturations on Room Air while Ambulating = 84%  Patient Saturations on 2 Liters of oxygen while Ambulating = 94%  Please briefly explain why patient needs home oxygen: In order to maintain oxygen saturation >88% during activity.    Recommendations for follow up therapy are one component of a multi-disciplinary discharge planning process, led by the attending physician.  Recommendations may be updated based on patient status, additional functional criteria and insurance authorization.  Follow Up Recommendations  No PT follow up     Assistance Recommended at Discharge Set up Supervision/Assistance  Patient can return home with the following Assistance with cooking/housework   Equipment Recommendations  Rolling walker (2 wheels);Other (comment) (home 2 needed at this time)    Recommendations for Other Services       Precautions / Restrictions Precautions Precautions: Fall Restrictions Weight Bearing Restrictions: No     Mobility  Bed Mobility Overal bed mobility: Modified Independent                   Transfers Overall transfer level: Needs assistance Equipment used: None Transfers: Sit to/from Stand Sit to Stand: Supervision           General transfer comment: Performed x 4    Ambulation/Gait Ambulation/Gait assistance: Supervision Gait Distance (Feet): 100 Feet (100'x2) Assistive device: None Gait Pattern/deviations: Step-through pattern;Trunk flexed;Decreased stride length Gait velocity: decreased     General Gait Details: Slow gait with trunk flexed (baseline kyphosis/scolosis) but no LOB; did need cues for breathing in through nose and focus on breathing with activity; ambulated 100'x2   Stairs             Wheelchair Mobility    Modified Rankin (Stroke Patients Only)       Balance Overall balance assessment: Needs assistance   Sitting balance-Leahy Scale: Normal     Standing balance support: No upper extremity supported Standing balance-Leahy Scale: Good                              Cognition Arousal/Alertness: Awake/alert Behavior During Therapy: WFL for tasks assessed/performed Overall Cognitive Status: No family/caregiver present to determine baseline cognitive functioning                                 General Comments: Overall WFL, alert and oriented x 4        Exercises      General Comments General comments (skin integrity, edema, etc.): Pt on RA with sats 89%,  ambulated RA with sats 84%, rest break and recovered to 90% in <2 mins on RA; ambulated 2 L with sats 94%. Notified RN and MD.  Educated pt on incentive spirometer, gradual increase in activity, and use of O2 with activity      Pertinent Vitals/Pain Pain Assessment: No/denies pain    Home Living                          Prior Function            PT Goals (current goals can now be found in the care plan section) Progress towards PT goals: Progressing toward goals    Frequency    Min 3X/week      PT Plan Current plan  remains appropriate    Co-evaluation              AM-PAC PT "6 Clicks" Mobility   Outcome Measure  Help needed turning from your back to your side while in a flat bed without using bedrails?: None Help needed moving from lying on your back to sitting on the side of a flat bed without using bedrails?: None Help needed moving to and from a bed to a chair (including a wheelchair)?: None Help needed standing up from a chair using your arms (e.g., wheelchair or bedside chair)?: None Help needed to walk in hospital room?: A Little Help needed climbing 3-5 steps with a railing? : A Little 6 Click Score: 22    End of Session Equipment Utilized During Treatment: Oxygen Activity Tolerance: Patient tolerated treatment well Patient left: in bed;with call bell/phone within reach Nurse Communication: Mobility status PT Visit Diagnosis: Unsteadiness on feet (R26.81);Muscle weakness (generalized) (M62.81)     Time: 6301-6010 PT Time Calculation (min) (ACUTE ONLY): 19 min  Charges:  $Gait Training: 8-22 mins                     Anise Salvo, PT Acute Rehab Services Pager [phone] [facility] [phone]    Rayetta Humphrey 09/29/2021, 12:04 PM

## 2021-09-29 NOTE — TOC Transition Note (Signed)
Transition of Care [facility]) - CM/SW Discharge Note   Patient Details  Name: Jennifer Mcdaniel MRN: [id number] Date of Birth: 1961-02-14  Transition of Care [facility]) CM/SW Contact:  Ida Rogue, LCSW Phone Number: 09/29/2021, 1:06 PM   Clinical Narrative:   Patient who is stable for d/c is in need of home O2.  SAT note and orders seen and appreciated. Contacted Danielle with [facility] who will arrange for delivery of home unit and travel cannister.  No further needs identified.  TOC sign off.    Final next level of care: Home/Self Care Barriers to Discharge: No Barriers Identified   Patient Goals and CMS Choice        Discharge Placement                       Discharge Plan and Services                                     Social Determinants of Health (SDOH) Interventions     Readmission Risk Interventions No flowsheet data found.

## 2021-09-29 NOTE — Discharge Summary (Signed)
Discharge Summary  Jennifer Mcdaniel [id number] DOB: 10-31-1960  PCP: Guadlupe Spanish, MD  Admit date: 09/24/2021 Discharge date: 09/29/2021  Time spent: 35 minutes   Recommendations for Outpatient Follow-up:  Follow up with your PCP Follow up with pulmonary within a week Take your medications as prescribed Continue PT with assistance   Discharge Diagnoses:  Active Hospital Problems   Diagnosis Date Noted   Acute exacerbation of chronic obstructive pulmonary disease (COPD) ([place]) 09/25/2021    Resolved Hospital Problems  No resolved problems to display.    Discharge Condition: Stable   Diet recommendation: Continue previous diet   Vitals:   09/28/21 2041 09/29/21 0411  BP: 115/70 117/74  Pulse: 87 91  Resp: 18   Temp: 98.1 F (36.7 C) 98 F (36.7 C)  SpO2: 98% 90%    History of present illness:  Jennifer Mcdaniel is a 61 y.o. female with medical history significant of COPD, ongoing tobacco use disorder, chronic anxiety/depression, chronic pain syndrome, C. difficile colitis (02/11/20) who presented from home with complaints of dyspnea, associated with confusion.  She was brought in by her husband due to concern for confusion.  Patient's husband wanted to have psych evaluation.  However, in the emergency room the patient was found to be hypoxic with a positive COVID-19 screening test.  Started on antiviral IV remdesivir and IV steroids, Solu-Medrol, along with bronchodilators, and pulmonary toilet.  Due to concern for polypharmacy, psychiatry was consulted to assist with the management of her psych medications.  Denies suicidal or homicidal ideation.   09/29/2021:  Seen and examined at baseline.  Feels better this morning.  No new complaints.  Receptive to quitting tobacco use.  Hospital Course:  Principal Problem:   Acute exacerbation of chronic obstructive pulmonary disease (COPD) ([place])  Acute exacerbation of COPD likely secondary to COVID-19 viral infection and  ongoing tobacco use Admission x-ray showing stable emphysema, no acute airspace disease. Received 3 days IV antiviral Remdesivir Switched to [employer] Received IV Solu-Medrol and Z-pack   Covid-19 viral infection High risk of complications Ongoing tobacco use, COPD Recent C-diff colitis Received 3 doses of IV Remdesivir Tmax 100.3 on 09/28/21 Switched to [facility] on 09/28/21 for dc planning due to high risk for complications.   Acute hypoxic respiratory failure secondary to above Not on oxygen supplementation at baseline Continue to maintain O2 saturation greater than 90% Continue management as stated above. Wean off oxygen supplementation as tolerated.   Chronic anxiety/depression Continue home antidepressants Appreciate psychiatry's recommendations Management per psychiatry: Continue to hold off home Wellbutrin, trazodone, and Remeron as recommended by psychiatry.   History of C. difficile colitis, diagnosed 02/11/20 Continue p.o. vancomycin while on other antibiotics and continue for additional 5 days after stopping other antibiotic. Continue Florastor twice daily She denies diarrhea or abdominal cramps. Afebrile with no leukocytosis   Tobacco use disorder Ongoing tobacco use Tobacco cessation counseling Continue Nicotine patch as needed   Chronic pain syndrome Continue home regimen   Alcohol use disorder with concern for alcohol withdrawal No evidence of alcohol withdrawal at the time of this visit. Continue Multivitamin, folic acid supplement, thiamine supplement.  Severe protein calorie malnutrition BMI 14 Continue to increase oral protein calorie intake        Code Status: Full code     Consultants: Psychiatry   Procedures: None   Antimicrobials: Azithromycin, 09/26/2021. P.o. vancomycin 09/27/2021   Discharge Exam: BP 117/74    Pulse 91    Temp 98 F (36.7 C) (  Oral)    Resp 18    Ht 5\' 4"  (1.626 m)    Wt 39 kg    SpO2 90%    BMI 14.76 kg/m   General: 61 y.o. year-old female Frail appearing.  Alert and oriented x3. Cardiovascular: Regular rate and rhythm with no rubs or gallops.  No thyromegaly or JVD noted.   Respiratory: Clear to auscultation with no wheezes or rales. Good inspiratory effort. Abdomen: Soft nontender nondistended with normal bowel sounds x4 quadrants. Musculoskeletal: No lower extremity edema bilaterally. Skin: No ulcerative lesions noted or rashes. Psychiatry: Mood is appropriate for condition and setting  Discharge Instructions You were cared for by a hospitalist during your hospital stay. If you have any questions about your discharge medications or the care you received while you were in the hospital after you are discharged, you can call the unit and asked to speak with the hospitalist on call if the hospitalist that took care of you is not available. Once you are discharged, your primary care physician will handle any further medical issues. Please note that NO REFILLS for any discharge medications will be authorized once you are discharged, as it is imperative that you return to your primary care physician (or establish a relationship with a primary care physician if you do not have one) for your aftercare needs so that they can reassess your need for medications and monitor your lab values.   Allergies as of 09/29/2021   No Known Allergies      Medication List     STOP taking these medications    buPROPion 300 MG 24 hr tablet Commonly known as: WELLBUTRIN XL   traZODone 50 MG tablet Commonly known as: DESYREL       TAKE these medications    albuterol 108 (90 Base) MCG/ACT inhaler Commonly known as: VENTOLIN HFA Inhale 2 puffs into the lungs every 4 (four) hours as needed for wheezing or shortness of breath.   ALPRAZolam 0.5 MG tablet Commonly known as: XANAX Take 0.5 mg by mouth 4 (four) times daily as needed for anxiety or sleep.   ARIPiprazole 2 MG tablet Commonly known as:  ABILIFY Take 2 mg by mouth daily.   ascorbic acid 500 MG tablet Commonly known as: VITAMIN C Take 1 tablet (500 mg total) by mouth daily. Start taking on: September 30, 2021   Breztri Aerosphere 160-9-4.8 MCG/ACT Aero Generic drug: Budeson-Glycopyrrol-Formoterol Inhale 2 puffs into the lungs 2 (two) times daily.   DULoxetine 60 MG capsule Commonly known as: CYMBALTA Take 60 mg by mouth 2 (two) times daily.   folic acid 1 MG tablet Commonly known as: FOLVITE Take 1 tablet (1 mg total) by mouth daily. Start taking on: September 30, 2021   GOODY HEADACHE PO Take 1 packet by mouth daily as needed (headache/pain).   mirtazapine 15 MG tablet Commonly known as: REMERON Take 15 mg by mouth at bedtime.   molnupiravir EUA 200 mg Caps capsule Commonly known as: LAGEVRIO Take 4 capsules (800 mg total) by mouth 2 (two) times daily for 3 days.   multivitamin with minerals Tabs tablet Take 1 tablet by mouth daily.   naloxone 0.4 MG/ML injection Commonly known as: NARCAN Inject 1 mL (0.4 mg total) into the vein as needed.   nicotine 21 mg/24hr patch Commonly known as: NICODERM CQ - dosed in mg/24 hours Place 21 mg onto the skin daily.   Oxycodone HCl 10 MG Tabs Take 10 mg by mouth 2 (two) times daily.  oxyCODONE 20 mg 12 hr tablet Commonly known as: OXYCONTIN Take 20 mg by mouth every 12 (twelve) hours.   pregabalin 150 MG capsule Commonly known as: LYRICA Take 150 mg by mouth 3 (three) times daily.   saccharomyces boulardii 250 MG capsule Commonly known as: FLORASTOR Take 1 capsule (250 mg total) by mouth 2 (two) times daily.   thiamine 100 MG tablet Take 1 tablet (100 mg total) by mouth daily. Start taking on: September 30, 2021   tiZANidine 4 MG tablet Commonly known as: ZANAFLEX Take 4 mg by mouth 3 (three) times daily.   vancomycin 125 MG capsule Commonly known as: VANCOCIN Take 1 capsule (125 mg total) by mouth daily for 5 days. Start taking on: September 30, 2021    zinc sulfate 220 (50 Zn) MG capsule Take 1 capsule (220 mg total) by mouth daily. Start taking on: September 30, 2021               Durable Medical Equipment  (From admission, onward)           Start     Ordered   09/29/21 1216  For home use only DME oxygen  Once       Question Answer Comment  Length of Need 6 Months   Mode or (Route) Nasal cannula   Liters per Minute 2   Frequency Continuous (stationary and portable oxygen unit needed)   Oxygen conserving device Yes   Oxygen delivery system Gas      09/29/21 1215   09/29/21 1216  For home use only DME Pulse oximeter  Once        09/29/21 1215           No Known Allergies  Follow-up Information     [facility] Follow up.   Why: Your home [place], also known as [place] information: [address] [place] [address] [phone]         Guadlupe Spanish, MD. Call today.   Specialty: Internal Medicine Why: Please call for a post hospital follow up appointment Contact information: [place] [address] [phone]                  The results of significant diagnostics from this hospitalization (including imaging, microbiology, ancillary and laboratory) are listed below for reference.    Significant Diagnostic Studies: DG Chest 2 View  Result Date: 09/25/2021 CLINICAL DATA:  Hypoxia, altered level of consciousness EXAM: CHEST - 2 VIEW COMPARISON:  02/09/2020 FINDINGS: Frontal and lateral views of the chest demonstrate an unremarkable cardiac silhouette. Chronic background emphysema, without superimposed airspace disease, effusion, or pneumothorax. Stable postsurgical changes of the thoracolumbar spine. IMPRESSION: 1. Stable emphysema.  No acute airspace disease. Electronically Signed   By: Randa Ngo M.D.   On: 09/25/2021 01:00   CT Head Wo Contrast  Result Date: 09/25/2021 CLINICAL DATA:  Altered mental status. EXAM: CT HEAD WITHOUT CONTRAST  TECHNIQUE: Contiguous axial images were obtained from the base of the skull through the vertex without intravenous contrast. COMPARISON:  None. FINDINGS: Brain: No evidence of acute infarction, hemorrhage, hydrocephalus, extra-axial collection or mass lesion/mass effect. There is mild cerebral atrophy and small vessel disease, unremarkable cerebellum and brainstem. The ventricles are normal in size and position. Vascular: No hyperdense vessel or unexpected calcification. Skull: Normal. Negative for fracture or focal lesion. Sinuses/Orbits: No acute finding.  The nasal septum deviates left. Other: None. IMPRESSION: No acute intracranial CT  findings. Mild features of atrophy and small vessel disease. Electronically Signed   By: Telford Nab M.D.   On: 09/25/2021 02:07    Microbiology: Recent Results (from the past 240 hour(s))  Resp Panel by RT-PCR (Flu A&B, Covid) Nasopharyngeal Swab     Status: Abnormal   Collection Time: 09/25/21  1:23 AM   Specimen: Nasopharyngeal Swab; Nasopharyngeal(NP) swabs in vial transport medium  Result Value Ref Range Status   SARS Coronavirus 2 by RT PCR POSITIVE (A) NEGATIVE Final    Comment: (NOTE) SARS-CoV-2 target nucleic acids are DETECTED.  The SARS-CoV-2 RNA is generally detectable in upper respiratory specimens during the acute phase of infection. Positive results are indicative of the presence of the identified virus, but do not rule out bacterial infection or co-infection with other pathogens not detected by the test. Clinical correlation with patient history and other diagnostic information is necessary to determine patient infection status. The expected result is Negative.  Fact Sheet for Patients: EntrepreneurPulse.com.au  Fact Sheet for Healthcare Providers: IncredibleEmployment.be  This test is not yet approved or cleared by the [place] FDA and  has been authorized for detection and/or diagnosis of  SARS-CoV-2 by FDA under an Emergency Use Authorization (EUA).  This EUA will remain in effect (meaning this test can be used) for the duration of  the COVID-19 declaration under Section 564(b)(1) of the A ct, 21 U.S.C. section 360bbb-3(b)(1), unless the authorization is terminated or revoked sooner.     Influenza A by PCR NEGATIVE NEGATIVE Final   Influenza B by PCR NEGATIVE NEGATIVE Final    Comment: (NOTE) The Xpert Xpress SARS-CoV-2/FLU/RSV plus assay is intended as an aid in the diagnosis of influenza from Nasopharyngeal swab specimens and should not be used as a sole basis for treatment. Nasal washings and aspirates are unacceptable for Xpert Xpress SARS-CoV-2/FLU/RSV testing.  Fact Sheet for Patients: EntrepreneurPulse.com.au  Fact Sheet for Healthcare Providers: IncredibleEmployment.be  This test is not yet approved or cleared by the [place] FDA and has been authorized for detection and/or diagnosis of SARS-CoV-2 by FDA under an Emergency Use Authorization (EUA). This EUA will remain in effect (meaning this test can be used) for the duration of the COVID-19 declaration under Section 564(b)(1) of the Act, 21 U.S.C. section 360bbb-3(b)(1), unless the authorization is terminated or revoked.  Performed at [facility], [place] [address] [place], [place] [address]      Labs: Basic Metabolic Panel: Recent Labs  Lab 09/25/21 1740 09/25/21 2336 09/26/21 0558 09/27/21 0601 09/28/21 0535 09/29/21 0434  NA 132* 133* 134* 134* 133* 134*  K 3.7 3.3* 3.2* 4.2 3.7 3.7  CL 97* 96* 99 99 98 95*  CO2 26 27 28 30 29  33*  GLUCOSE 102* 89 85 92 88 80  BUN 6 8 8 9 11 10   CREATININE 0.33* 0.47 0.40* 0.47 0.53 0.59  CALCIUM 8.1* 8.1* 8.0* 8.4* 8.4* 8.8*  PHOS 3.5 3.4  --   --   --   --    Liver Function Tests: Recent Labs  Lab 09/25/21 0123 09/25/21 1740 09/25/21 2336 09/26/21 0558 09/27/21 0601 09/28/21 0535  09/29/21 0434  AST 30  --   --  27 42* 33 30  ALT 18  --   --  18 23 19 20   ALKPHOS 96  --   --  79 81 68 74  BILITOT 0.5  --   --  0.4 0.4 0.4 0.5  PROT 7.4  --   --  6.4* 7.2 6.5 7.0  ALBUMIN 3.1*   < > 2.8* 2.6* 3.0* 2.6* 3.0*   < > = values in this interval not displayed.   No results for input(s): LIPASE, AMYLASE in the last 168 hours. No results for input(s): AMMONIA in the last 168 hours. CBC: Recent Labs  Lab 09/25/21 0123 09/26/21 0558 09/27/21 0601 09/28/21 0535 09/29/21 0434  WBC 6.7 3.4* 6.8 5.6 5.0  NEUTROABS 5.7 1.7 5.4 2.8 2.0  HGB 15.1* 15.8* 17.4* 15.8* 16.9*  HCT 44.2 45.8 52.2* 47.4* 50.7*  MCV 91.7 90.2 92.7 90.8 92.0  PLT 271 285 307 278 291   Cardiac Enzymes: No results for input(s): CKTOTAL, CKMB, CKMBINDEX, TROPONINI in the last 168 hours. BNP: BNP (last 3 results) No results for input(s): BNP in the last 8760 hours.  ProBNP (last 3 results) No results for input(s): PROBNP in the last 8760 hours.  CBG: No results for input(s): GLUCAP in the last 168 hours.     Signed:  Kayleen Memos, MD [facility] 09/29/2021, 2:11 PM

## 2023-03-03 DIAGNOSIS — J9610 Chronic respiratory failure, unspecified whether with hypoxia or hypercapnia: Secondary | ICD-10-CM | POA: Diagnosis present

## 2023-04-14 IMAGING — CT CT HEAD W/O CM
3 series · 16 of 47 positions shown, 19 images · non-contrast
Comparison: None.

CLINICAL DATA: Altered mental status.

EXAM:
CT HEAD WITHOUT CONTRAST
TECHNIQUE: Contiguous axial images were obtained from the base of the skull
through the vertex without intravenous contrast.

[Series 2: head wo · axial · 0.47mm/px · z∈[-137,-12]mm · 10 of 31 slices shown, 13 images]
[im 3/31  brain]
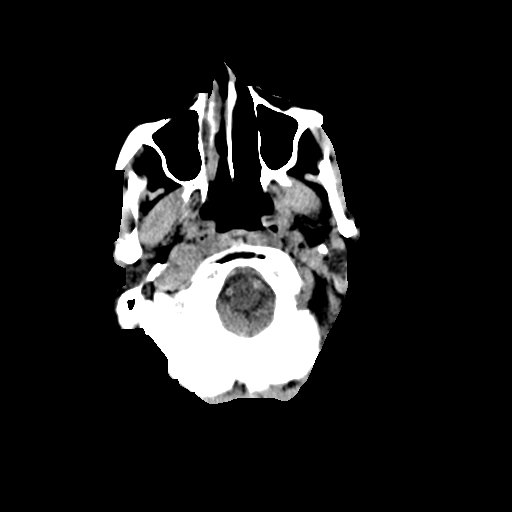
[im 3/31  bone]
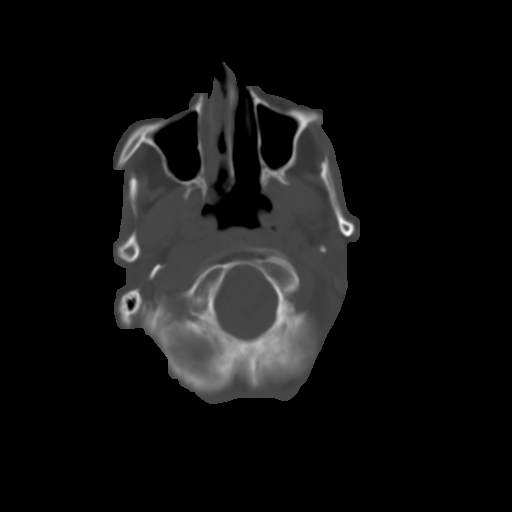
[im 6/31  brain]
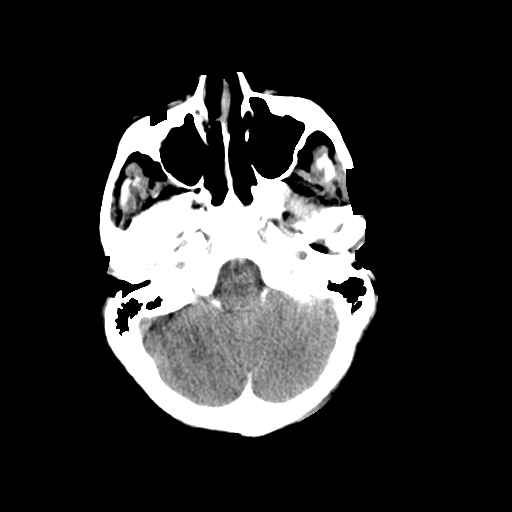
[im 9/31  brain]
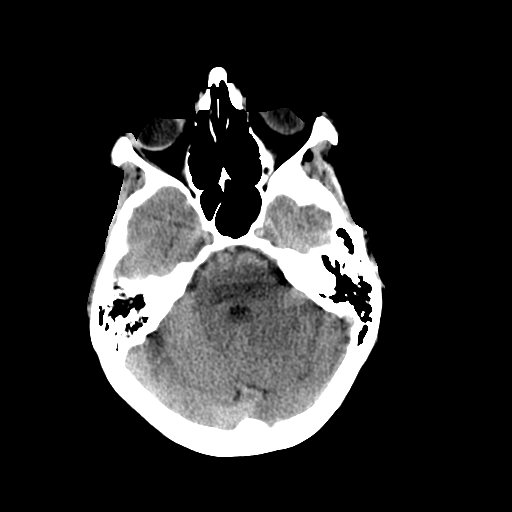
[im 11/31  brain]
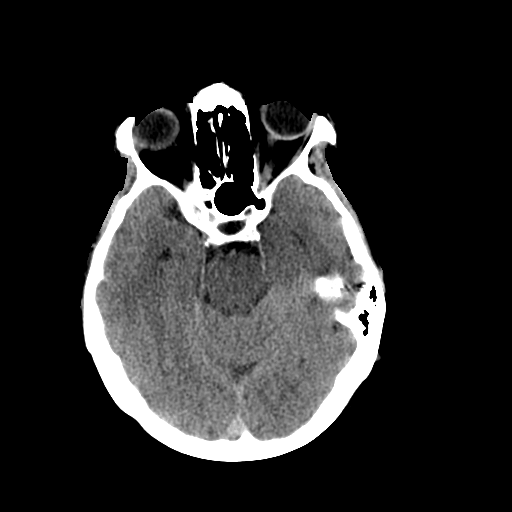
[im 14/31  brain]
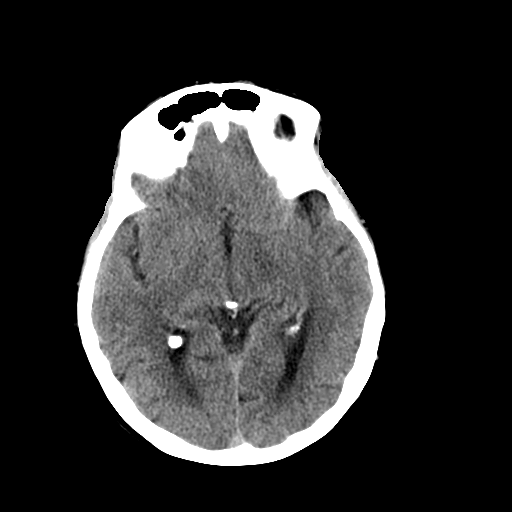
[im 14/31  bone]
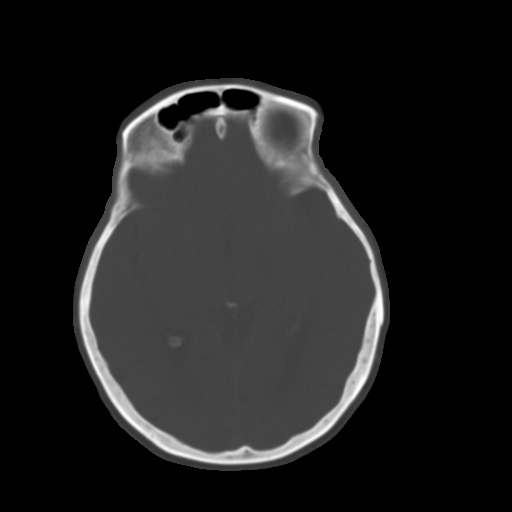
[im 17/31  brain]
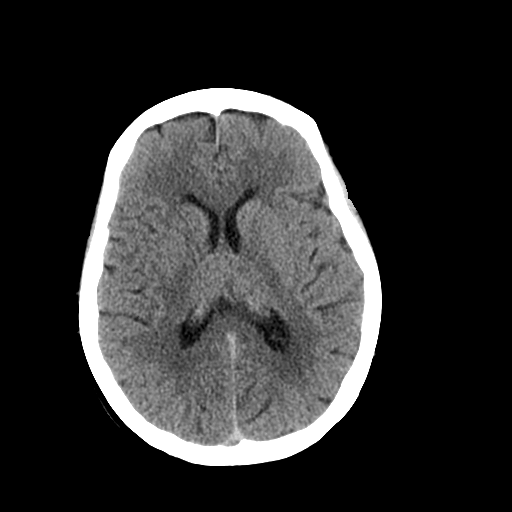
[im 20/31  brain]
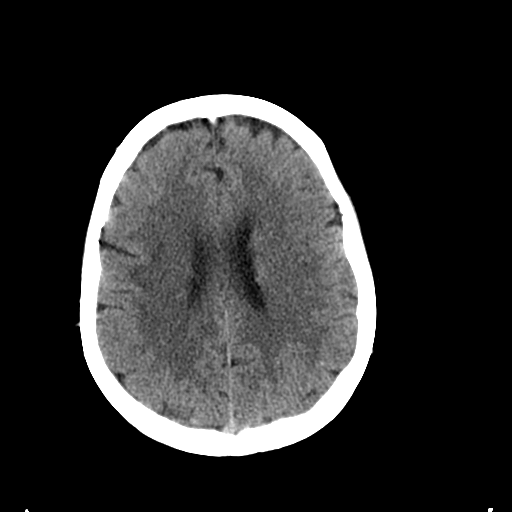
[im 23/31  brain]
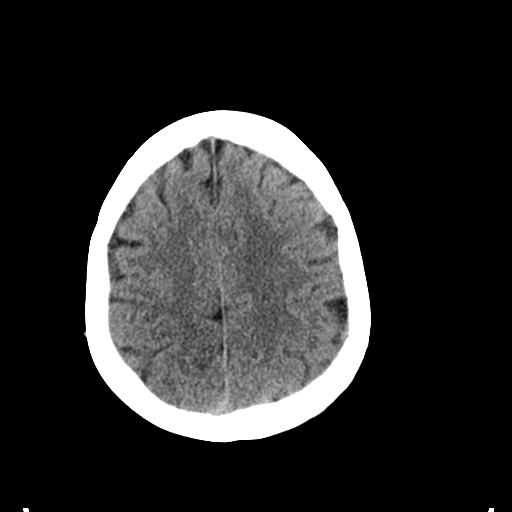
[im 25/31  brain]
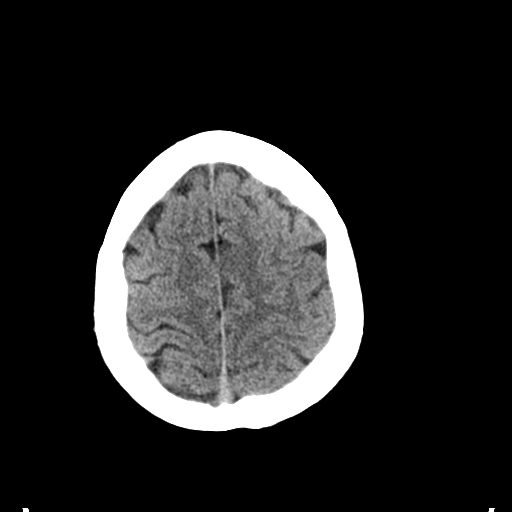
[im 25/31  bone]
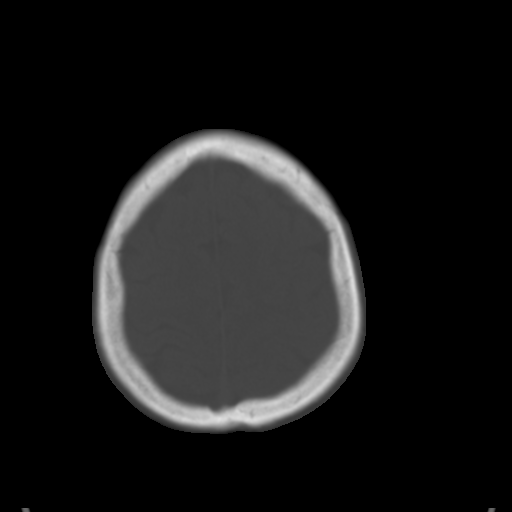
[im 28/31  brain]
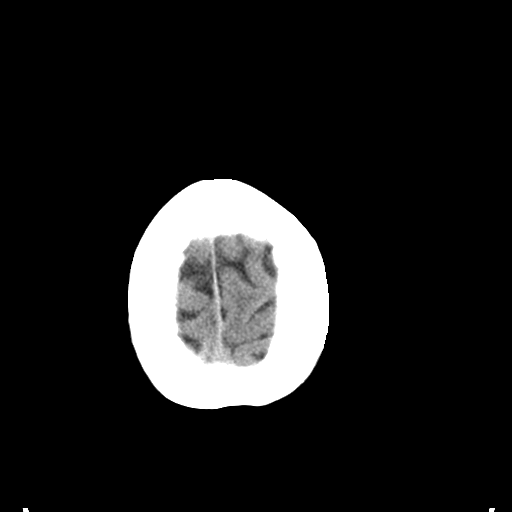

[Series 5: coronal soft tissue · coronal · 0.29mm/px · 3 of 63 slices shown]
[im 21/63  brain]
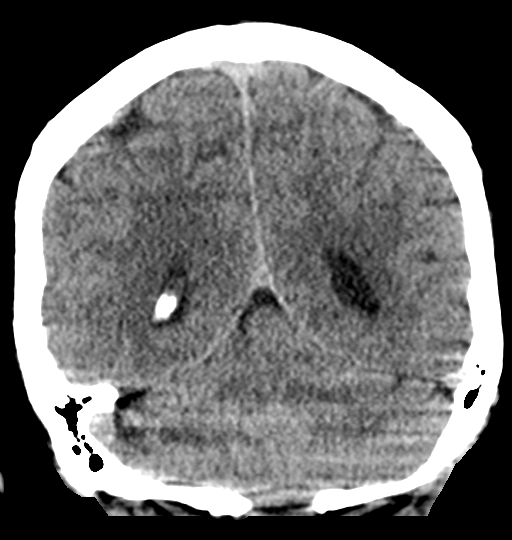
[im 28/63  brain]
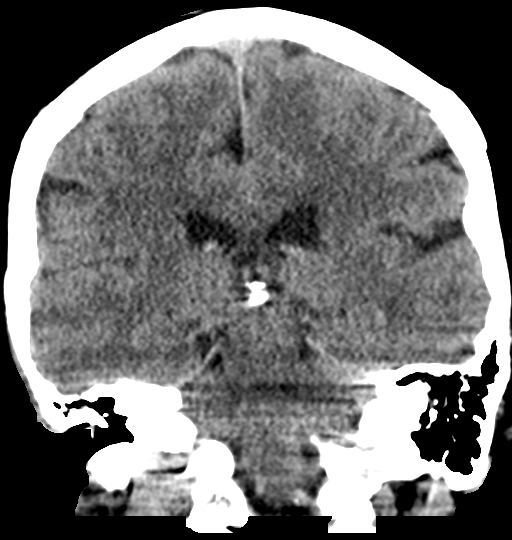
[im 35/63  brain]
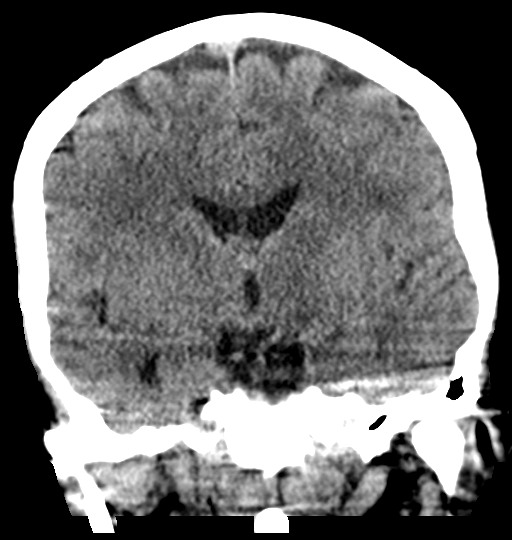

[Series 6: sagittal soft tissue · sagittal · 0.30mm/px · 3 of 49 slices shown]
[im 17/49  brain]
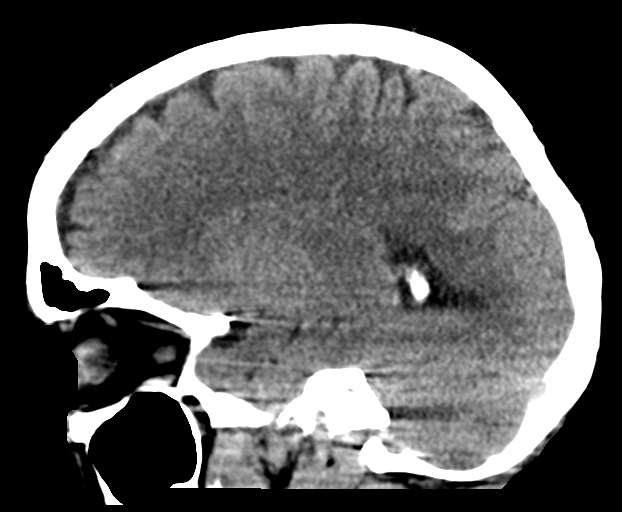
[im 25/49  brain]
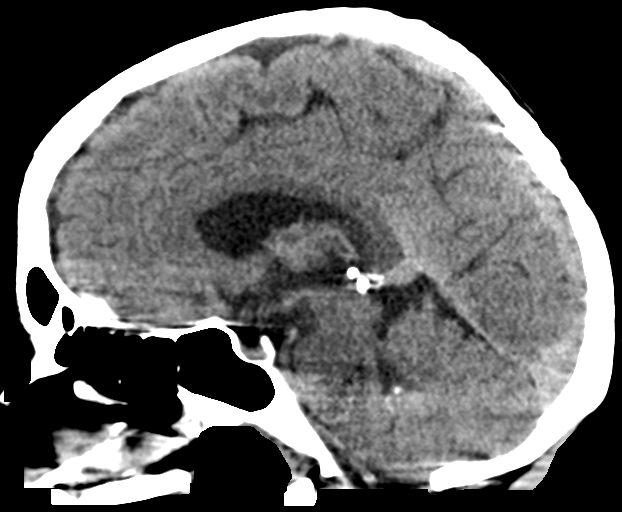
[im 33/49  brain]
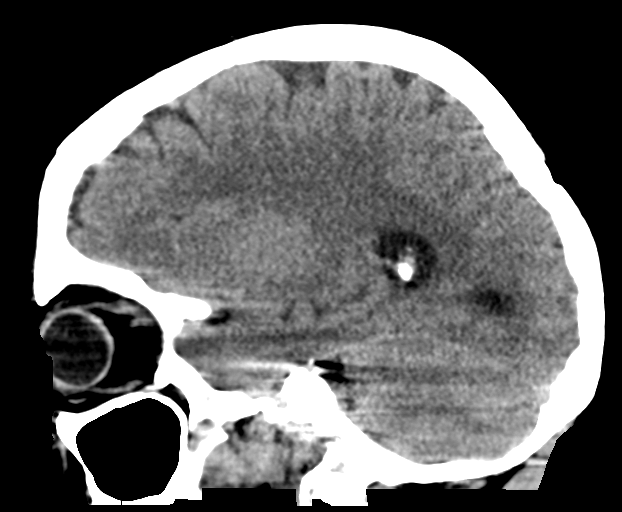

[16 of 47 positions shown; findings below may reference images not displayed]

FINDINGS: Brain: No evidence of acute infarction, hemorrhage, hydrocephalus,
extra-axial collection or mass lesion/mass effect. There is mild
cerebral atrophy and small vessel disease, unremarkable cerebellum
and brainstem. The ventricles are normal in size and position.

Vascular: No hyperdense vessel or unexpected calcification.

Skull: Normal. Negative for fracture or focal lesion.

Sinuses/Orbits: No acute finding.  The nasal septum deviates left.

Other: None.
IMPRESSION: No acute intracranial CT findings. Mild features of atrophy and
small vessel disease.

## 2023-07-26 ENCOUNTER — Other Ambulatory Visit: Payer: Self-pay

## 2023-07-26 ENCOUNTER — Observation Stay (HOSPITAL_COMMUNITY)
Admission: EM | Admit: 2023-07-26 | Discharge: 2023-08-01 | Disposition: A | Discharge status: Home / Self Care | Payer: Medicare HMO | Attending: Internal Medicine | Admitting: Internal Medicine

## 2023-07-26 ENCOUNTER — Emergency Department (HOSPITAL_COMMUNITY): Payer: Medicare HMO

## 2023-07-26 ENCOUNTER — Encounter (HOSPITAL_COMMUNITY): Payer: Self-pay

## 2023-07-26 DIAGNOSIS — Z79899 Other long term (current) drug therapy: Secondary | ICD-10-CM | POA: Diagnosis not present

## 2023-07-26 DIAGNOSIS — D75839 Thrombocytosis, unspecified: Secondary | ICD-10-CM | POA: Insufficient documentation

## 2023-07-26 DIAGNOSIS — R911 Solitary pulmonary nodule: Secondary | ICD-10-CM | POA: Insufficient documentation

## 2023-07-26 DIAGNOSIS — R627 Adult failure to thrive: Secondary | ICD-10-CM | POA: Insufficient documentation

## 2023-07-26 DIAGNOSIS — J449 Chronic obstructive pulmonary disease, unspecified: Secondary | ICD-10-CM | POA: Diagnosis present

## 2023-07-26 DIAGNOSIS — R45851 Suicidal ideations: Principal | ICD-10-CM

## 2023-07-26 DIAGNOSIS — F17210 Nicotine dependence, cigarettes, uncomplicated: Secondary | ICD-10-CM | POA: Insufficient documentation

## 2023-07-26 DIAGNOSIS — J441 Chronic obstructive pulmonary disease with (acute) exacerbation: Principal | ICD-10-CM | POA: Diagnosis present

## 2023-07-26 DIAGNOSIS — R636 Underweight: Secondary | ICD-10-CM | POA: Insufficient documentation

## 2023-07-26 DIAGNOSIS — D649 Anemia, unspecified: Secondary | ICD-10-CM | POA: Insufficient documentation

## 2023-07-26 DIAGNOSIS — R4182 Altered mental status, unspecified: Secondary | ICD-10-CM | POA: Diagnosis present

## 2023-07-26 DIAGNOSIS — F909 Attention-deficit hyperactivity disorder, unspecified type: Secondary | ICD-10-CM | POA: Diagnosis present

## 2023-07-26 DIAGNOSIS — J9610 Chronic respiratory failure, unspecified whether with hypoxia or hypercapnia: Secondary | ICD-10-CM | POA: Diagnosis present

## 2023-07-26 DIAGNOSIS — E441 Mild protein-calorie malnutrition: Secondary | ICD-10-CM | POA: Diagnosis present

## 2023-07-26 DIAGNOSIS — G894 Chronic pain syndrome: Secondary | ICD-10-CM | POA: Insufficient documentation

## 2023-07-26 DIAGNOSIS — F32A Depression, unspecified: Secondary | ICD-10-CM | POA: Diagnosis present

## 2023-07-26 DIAGNOSIS — F17200 Nicotine dependence, unspecified, uncomplicated: Secondary | ICD-10-CM | POA: Diagnosis present

## 2023-07-26 DIAGNOSIS — E43 Unspecified severe protein-calorie malnutrition: Secondary | ICD-10-CM | POA: Diagnosis not present

## 2023-07-26 LAB — COMPREHENSIVE METABOLIC PANEL
ALT: 15 U/L (ref 0–44)
AST: 16 U/L (ref 15–41)
Albumin: 3.2 g/dL — ABNORMAL LOW (ref 3.5–5.0)
Alkaline Phosphatase: 72 U/L (ref 38–126)
Anion gap: 12 (ref 5–15)
BUN: 8 mg/dL (ref 8–23)
CO2: 29 mmol/L (ref 22–32)
Calcium: 8.7 mg/dL — ABNORMAL LOW (ref 8.9–10.3)
Chloride: 94 mmol/L — ABNORMAL LOW (ref 98–111)
Creatinine, Ser: <0.3 mg/dL — ABNORMAL LOW (ref 0.44–1.00)
Glucose, Bld: 101 mg/dL — ABNORMAL HIGH (ref 70–99)
Potassium: 4 mmol/L (ref 3.5–5.1)
Sodium: 135 mmol/L (ref 135–145)
Total Bilirubin: 0.6 mg/dL (ref 0.3–1.2)
Total Protein: 7.4 g/dL (ref 6.5–8.1)

## 2023-07-26 LAB — CBC WITH DIFFERENTIAL/PLATELET
Abs Immature Granulocytes: 0.05 10*3/uL (ref 0.00–0.07)
Basophils Absolute: 0.1 10*3/uL (ref 0.0–0.1)
Basophils Relative: 0 %
Eosinophils Absolute: 0 10*3/uL (ref 0.0–0.5)
Eosinophils Relative: 0 %
HCT: 35.8 % — ABNORMAL LOW (ref 36.0–46.0)
Hemoglobin: 11.5 g/dL — ABNORMAL LOW (ref 12.0–15.0)
Immature Granulocytes: 0 %
Lymphocytes Relative: 7 %
Lymphs Abs: 1.3 10*3/uL (ref 0.7–4.0)
MCH: 30.7 pg (ref 26.0–34.0)
MCHC: 32.1 g/dL (ref 30.0–36.0)
MCV: 95.5 fL (ref 80.0–100.0)
Monocytes Absolute: 0.8 10*3/uL (ref 0.1–1.0)
Monocytes Relative: 4 %
Neutro Abs: 15.8 10*3/uL — ABNORMAL HIGH (ref 1.7–7.7)
Neutrophils Relative %: 89 %
Platelets: 443 10*3/uL — ABNORMAL HIGH (ref 150–400)
RBC: 3.75 MIL/uL — ABNORMAL LOW (ref 3.87–5.11)
RDW: 12.6 % (ref 11.5–15.5)
WBC: 18 10*3/uL — ABNORMAL HIGH (ref 4.0–10.5)
nRBC: 0 % (ref 0.0–0.2)

## 2023-07-26 LAB — URINALYSIS, ROUTINE W REFLEX MICROSCOPIC
Bilirubin Urine: NEGATIVE
Glucose, UA: NEGATIVE mg/dL
Hgb urine dipstick: NEGATIVE
Ketones, ur: NEGATIVE mg/dL
Leukocytes,Ua: NEGATIVE
Nitrite: NEGATIVE
Protein, ur: NEGATIVE mg/dL
Specific Gravity, Urine: 1.016 (ref 1.005–1.030)
pH: 7 (ref 5.0–8.0)

## 2023-07-26 LAB — RAPID URINE DRUG SCREEN, HOSP PERFORMED
Amphetamines: NOT DETECTED
Barbiturates: NOT DETECTED
Benzodiazepines: POSITIVE — AB
Cocaine: NOT DETECTED
Opiates: NOT DETECTED
Tetrahydrocannabinol: NOT DETECTED

## 2023-07-26 LAB — ETHANOL: Alcohol, Ethyl (B): <10 mg/dL (ref <10)

## 2023-07-26 MED ORDER — METHOCARBAMOL 500 MG PO TABS
500.0000 mg | ORAL_TABLET | Freq: Once | ORAL | Status: AC
  Administered 2023-07-26: 500 mg via ORAL
  Filled 2023-07-26: qty 1

## 2023-07-26 MED ORDER — ALPRAZOLAM 0.5 MG PO TABS
0.5000 mg | ORAL_TABLET | Freq: Once | ORAL | Status: AC
  Administered 2023-07-26: 0.5 mg via ORAL
  Filled 2023-07-26: qty 1

## 2023-07-26 MED ORDER — IPRATROPIUM-ALBUTEROL 0.5-2.5 (3) MG/3ML IN SOLN
3.0000 mL | Freq: Once | RESPIRATORY_TRACT | Status: AC
  Administered 2023-07-26: 3 mL via RESPIRATORY_TRACT
  Filled 2023-07-26: qty 3

## 2023-07-26 MED ORDER — NICOTINE 21 MG/24HR TD PT24
21.0000 mg | MEDICATED_PATCH | Freq: Once | TRANSDERMAL | Status: AC
  Administered 2023-07-26: 21 mg via TRANSDERMAL
  Filled 2023-07-26: qty 1

## 2023-07-26 NOTE — ED Triage Notes (Signed)
Per EMS  Suicidal Husband wants psych eval AMS Over last week more forgetful

## 2023-07-26 NOTE — ED Provider Notes (Signed)
[place] EMERGENCY DEPARTMENT AT [facility] Provider Note   CSN: [id number] Arrival date & time: 07/26/23  1849     History  Chief Complaint  Patient presents with   Suicidal   Altered Mental Status    Jennifer Mcdaniel is a 62 y.o. female.  62 year old female with past medical history of advanced COPD as well as recently diagnosed lung cancer presenting to the emergency department today with concern for some intermittent confusion as well as suicidal ideations.  The patient states that she woke up this morning and was confused and thought that it was still last night.  States that she has had some intermittent confusion over the past few days but is feeling alert and oriented now.  She states that she was recently diagnosed with a lung mass but due to her advanced COPD they are unable to obtain biopsies and treat this.  She states that this has been making her very depressed.  She states that she has been having some passive suicidal ideations.  She denies any plan.  She denies any auditory hallucinations but states that she occasionally sees things that are not there.  She states she is here today for help regarding this.  She is feeling very anxious due to this new diagnosis.  She does report a cough but is more of a chronic issue with her COPD.   Altered Mental Status Presenting symptoms: confusion   Associated symptoms: hallucinations        Home Medications Prior to Admission medications   Medication Sig Start Date End Date Taking? Authorizing Provider  albuterol (VENTOLIN HFA) 108 (90 Base) MCG/ACT inhaler Inhale 2 puffs into the lungs every 4 (four) hours as needed for wheezing or shortness of breath.  01/12/20   [provider]  ALPRAZolam Prudy Feeler) 0.5 MG tablet Take 0.5 mg by mouth 4 (four) times daily as needed for anxiety or sleep. 02/08/20   [provider]  ARIPiprazole (ABILIFY) 2 MG tablet Take 2 mg by mouth daily. 07/28/21   [provider]  Aspirin-Acetaminophen-Caffeine (GOODY HEADACHE PO) Take 1 packet by mouth daily as needed (headache/pain).    [provider]  Budeson-Glycopyrrol-Formoterol (BREZTRI AEROSPHERE) 160-9-4.8 MCG/ACT AERO Inhale 2 puffs into the lungs 2 (two) times daily.    [provider]  DULoxetine (CYMBALTA) 60 MG capsule Take 60 mg by mouth 2 (two) times daily. 01/17/20   [provider]  mirtazapine (REMERON) 15 MG tablet Take 15 mg by mouth at bedtime. 09/02/21   [provider]  Multiple Vitamin (MULTIVITAMIN WITH MINERALS) TABS tablet Take 1 tablet by mouth daily.    [provider]  naloxone (NARCAN) 0.4 MG/ML injection Inject 1 mL (0.4 mg total) into the vein as needed. 09/29/21   Darlin Drop, DO  nicotine (NICODERM CQ - DOSED IN MG/24 HOURS) 21 mg/24hr patch Place 21 mg onto the skin daily.  Patient not taking: Reported on 09/25/2021 01/12/20   [provider]  oxyCODONE (OXYCONTIN) 20 mg 12 hr tablet Take 20 mg by mouth every 12 (twelve) hours.  10/30/19   [provider]  Oxycodone HCl 10 MG TABS Take 10 mg by mouth 2 (two) times daily. 08/09/15   [provider]  pregabalin (LYRICA) 150 MG capsule Take 150 mg by mouth 3 (three) times daily. 09/11/21   [provider]  tiZANidine (ZANAFLEX) 4 MG tablet Take 4 mg by mouth 3 (three) times daily. 03/02/13   [provider]      Allergies    Patient has no known allergies.    Review of Systems   Review of Systems  Respiratory:  Positive for cough.   Psychiatric/Behavioral:  Positive for confusion, hallucinations and suicidal ideas.   All other systems reviewed and are negative.   Physical Exam Updated Vital Signs BP 111/68 (BP Location: Left Arm)   Pulse 80   Temp 98.1 F (36.7 C) (Oral)   Resp 16   Ht 5\' 6"  (1.676 m)   Wt 37.2 kg   SpO2 91%   BMI 13.24 kg/m  Physical Exam Vitals and nursing note reviewed.   Gen: Chronically ill  appearing Eyes: PERRL, EOMI HEENT: no oropharyngeal swelling Neck: trachea midline Resp: Mild wheezes throughout all lung fields Card: RRR, no murmurs, rubs, or gallops Abd: nontender, nondistended Extremities: no calf tenderness, no edema Vascular: 2+ radial pulses bilaterally, 2+ DP pulses bilaterally Skin: no rashes Psyc: acting appropriately, tearful at times during interview   ED Results / Procedures / Treatments   Labs (all labs ordered are listed, but only abnormal results are displayed) Labs Reviewed  COMPREHENSIVE METABOLIC PANEL - Abnormal; Notable for the following components:      Result Value   Chloride 94 (*)    Glucose, Bld 101 (*)    Creatinine, Ser <0.30 (*)    Calcium 8.7 (*)    Albumin 3.2 (*)    All other components within normal limits  RAPID URINE DRUG SCREEN, HOSP PERFORMED - Abnormal; Notable for the following components:   Benzodiazepines POSITIVE (*)    All other components within normal limits  CBC WITH DIFFERENTIAL/PLATELET - Abnormal; Notable for the following components:   WBC 18.0 (*)    RBC 3.75 (*)    Hemoglobin 11.5 (*)    HCT 35.8 (*)    Platelets 443 (*)    Neutro Abs 15.8 (*)    All other components within normal limits  ETHANOL  URINALYSIS, ROUTINE W REFLEX MICROSCOPIC    EKG None  Radiology DG Chest 1 View  Result Date: 07/26/2023 CLINICAL DATA:  Chest pain EXAM: CHEST  1 VIEW COMPARISON:  Chest x-ray 09/25/2021 FINDINGS: The lungs are clear. There is no pleural effusion or pneumothorax. The cardiomediastinal silhouette is within normal limits. Dextroconvex scoliosis and fixation rods are again noted. IMPRESSION: No active disease. Electronically Signed   By: Darliss Cheney M.D.   On: 07/26/2023 22:55   CT Head Wo Contrast  Result Date: 07/26/2023 CLINICAL DATA:  Memory loss EXAM: CT HEAD WITHOUT CONTRAST TECHNIQUE: Contiguous axial images were obtained from the base of the skull through the vertex without intravenous contrast.  RADIATION DOSE REDUCTION: This exam was performed according to the departmental dose-optimization program which includes automated exposure control, adjustment of the mA and/or kV according to patient size and/or use of iterative reconstruction technique. COMPARISON:  09/25/2021 FINDINGS: Brain: No mass, hemorrhage or extra-axial collection. There is hypoattenuation of the white matter. CSF spaces are normal. Vascular: No hyperdense vessel or unexpected calcification. Skull: Normal Sinuses/Orbits: Paranasal sinuses are clear. No mastoid effusion. Normal orbits. Other: None IMPRESSION: 1. No acute intracranial abnormality. 2. Chronic small vessel ischemia. Electronically Signed   By: Deatra Robinson M.D.   On: 07/26/2023 22:44    Procedures Procedures    Medications Ordered in ED Medications  nicotine (NICODERM CQ - dosed in mg/24 hours) patch 21 mg (21 mg Transdermal Patch Applied 07/26/23 2218)  ALPRAZolam (XANAX) tablet 0.5 mg (0.5 mg  Oral Given 07/26/23 2218)  methocarbamol (ROBAXIN) tablet 500 mg (500 mg Oral Given 07/26/23 2256)  ipratropium-albuterol (DUONEB) 0.5-2.5 (3) MG/3ML nebulizer solution 3 mL (3 mLs Nebulization Given 07/26/23 2311)    ED Course/ Medical Decision Making/ A&P                                 Medical Decision Making 62 year old female with past medical history of advanced COPD, depression, and recently diagnosed lung cancer presenting to the emergency department today with passive suicidal ideations as well as some intermittent confusion.  The patient is alert and oriented here.  I will further evaluate her here with basic labs as well as a chest x-ray, urinalysis, and head CT to evaluate for organic causes for her symptoms.  If her work appears unremarkable I will have her evaluated by psychiatry.  Some of her symptoms and thoughts are certainly understandable given her recent diagnosis of cancer that would likely be terminal.  She is currently voluntary and seeking  help to hold off on placing her on IVC at this time.  Patient's labs are reassuring.  She does have a leukocytosis which is nonspecific.  She is not have any other laboratory values that are abnormal here.  Her chest x-ray is unremarkable.  CT scan of her head show some chronic findings but nothing acute.  Her pulse ox is 91 to 92% but the patient does have advanced COPD on oxygen.  Think that she is stable for psychiatric evaluation.  She is medically cleared at this time.  At this point she continues to be voluntary.  Amount and/or Complexity of Data Reviewed Labs: ordered. Radiology: ordered.  Risk OTC drugs. Prescription drug management.          Final Clinical Impression(s) / ED Diagnoses Final diagnoses:  Suicidal ideation    Rx / DC Orders ED Discharge Orders     None         Durwin Glaze, MD 07/26/23 2324

## 2023-07-26 NOTE — ED Notes (Signed)
Belonging are in the cabinet for Room 5-8 at nurses station.

## 2023-07-26 NOTE — BH Assessment (Signed)
TTS consult has been deferred to [facility]. [facility] Coordinator will reach out in secure chat with provider name and assessment time.

## 2023-07-26 NOTE — ED Notes (Signed)
Patients jewelry was taken off except 2 rings. There is a gold single band and a silver single band on each middle finger. Watch, bracelet, and three rings was put into a cup and labeled. Placed with belongings bag.

## 2023-07-27 ENCOUNTER — Encounter (HOSPITAL_COMMUNITY): Payer: Self-pay | Admitting: Internal Medicine

## 2023-07-27 DIAGNOSIS — M419 Scoliosis, unspecified: Secondary | ICD-10-CM | POA: Insufficient documentation

## 2023-07-27 DIAGNOSIS — D649 Anemia, unspecified: Secondary | ICD-10-CM | POA: Diagnosis present

## 2023-07-27 DIAGNOSIS — J449 Chronic obstructive pulmonary disease, unspecified: Secondary | ICD-10-CM | POA: Diagnosis present

## 2023-07-27 DIAGNOSIS — E441 Mild protein-calorie malnutrition: Secondary | ICD-10-CM | POA: Diagnosis present

## 2023-07-27 DIAGNOSIS — J441 Chronic obstructive pulmonary disease with (acute) exacerbation: Secondary | ICD-10-CM | POA: Diagnosis present

## 2023-07-27 DIAGNOSIS — F4323 Adjustment disorder with mixed anxiety and depressed mood: Secondary | ICD-10-CM | POA: Diagnosis not present

## 2023-07-27 DIAGNOSIS — D75839 Thrombocytosis, unspecified: Secondary | ICD-10-CM | POA: Diagnosis present

## 2023-07-27 MED ORDER — BUDESON-GLYCOPYRROL-FORMOTEROL 160-9-4.8 MCG/ACT IN AERO: 2.0000 | INHALATION_SPRAY | Freq: Two times a day (BID) | RESPIRATORY_TRACT | Status: DC

## 2023-07-27 MED ORDER — ALBUTEROL SULFATE HFA 108 (90 BASE) MCG/ACT IN AERS
1.0000 | INHALATION_SPRAY | RESPIRATORY_TRACT | Status: DC | PRN
Start: 2023-07-27 — End: 2023-07-27
  Administered 2023-07-27: 2 via RESPIRATORY_TRACT

## 2023-07-27 MED ORDER — ALPRAZOLAM 0.5 MG PO TABS
0.5000 mg | ORAL_TABLET | Freq: Three times a day (TID) | ORAL | Status: DC | PRN
  Administered 2023-07-27: 0.5 mg via ORAL
  Filled 2023-07-27: qty 1

## 2023-07-27 MED ORDER — BUPROPION HCL ER (XL) 300 MG PO TB24
300.0000 mg | ORAL_TABLET | Freq: Every day | ORAL | Status: DC
  Administered 2023-07-27 – 2023-08-01 (×6): 300 mg via ORAL
  Filled 2023-07-27: qty 2
  Filled 2023-07-27 (×5): qty 1

## 2023-07-27 MED ORDER — NICOTINE 14 MG/24HR TD PT24
14.0000 mg | MEDICATED_PATCH | Freq: Every day | TRANSDERMAL | Status: DC
  Administered 2023-07-27 – 2023-07-31 (×5): 14 mg via TRANSDERMAL
  Filled 2023-07-27 (×5): qty 1

## 2023-07-27 MED ORDER — ENSURE ENLIVE PO LIQD
237.0000 mL | Freq: Two times a day (BID) | ORAL | Status: DC
Start: 2023-07-28 — End: 2023-08-01
  Administered 2023-07-28 – 2023-08-01 (×8): 237 mL via ORAL

## 2023-07-27 MED ORDER — NALOXONE HCL 0.4 MG/ML IJ SOLN: 0.4000 mg | INTRAMUSCULAR | Status: DC | PRN

## 2023-07-27 MED ORDER — PREDNISONE 20 MG PO TABS
40.0000 mg | ORAL_TABLET | Freq: Every day | ORAL | Status: AC
  Administered 2023-07-28 – 2023-08-01 (×5): 40 mg via ORAL
  Filled 2023-07-27 (×5): qty 2

## 2023-07-27 MED ORDER — OXYCODONE HCL ER 15 MG PO T12A
15.0000 mg | EXTENDED_RELEASE_TABLET | Freq: Two times a day (BID) | ORAL | Status: DC
  Administered 2023-07-27 – 2023-08-01 (×11): 15 mg via ORAL
  Filled 2023-07-27 (×11): qty 1

## 2023-07-27 MED ORDER — TIZANIDINE HCL 4 MG PO TABS
4.0000 mg | ORAL_TABLET | Freq: Once | ORAL | Status: AC
  Administered 2023-07-27: 4 mg via ORAL
  Filled 2023-07-27: qty 1

## 2023-07-27 MED ORDER — ONDANSETRON HCL 4 MG PO TABS: 4.0000 mg | ORAL_TABLET | Freq: Four times a day (QID) | ORAL | Status: DC | PRN

## 2023-07-27 MED ORDER — ONDANSETRON HCL 4 MG/2ML IJ SOLN: 4.0000 mg | Freq: Four times a day (QID) | INTRAMUSCULAR | Status: DC | PRN

## 2023-07-27 MED ORDER — UMECLIDINIUM BROMIDE 62.5 MCG/ACT IN AEPB
1.0000 | INHALATION_SPRAY | Freq: Every day | RESPIRATORY_TRACT | Status: DC
  Administered 2023-07-27: 1 via RESPIRATORY_TRACT
  Filled 2023-07-27: qty 7

## 2023-07-27 MED ORDER — ALBUTEROL SULFATE HFA 108 (90 BASE) MCG/ACT IN AERS
1.0000 | INHALATION_SPRAY | Freq: Once | RESPIRATORY_TRACT | Status: AC
  Administered 2023-07-27: 2 via RESPIRATORY_TRACT
  Filled 2023-07-27: qty 6.7

## 2023-07-27 MED ORDER — ALPRAZOLAM 0.5 MG PO TABS
0.5000 mg | ORAL_TABLET | Freq: Four times a day (QID) | ORAL | Status: DC | PRN
  Administered 2023-07-27 – 2023-08-01 (×16): 0.5 mg via ORAL
  Filled 2023-07-27 (×16): qty 1

## 2023-07-27 MED ORDER — DULOXETINE HCL 30 MG PO CPEP
60.0000 mg | ORAL_CAPSULE | Freq: Two times a day (BID) | ORAL | Status: DC
  Administered 2023-07-27 – 2023-08-01 (×11): 60 mg via ORAL
  Filled 2023-07-27 (×11): qty 2

## 2023-07-27 MED ORDER — CYPROHEPTADINE HCL 4 MG PO TABS
4.0000 mg | ORAL_TABLET | Freq: Every day | ORAL | Status: DC
  Administered 2023-07-27 – 2023-07-31 (×5): 4 mg via ORAL
  Filled 2023-07-27 (×6): qty 1

## 2023-07-27 MED ORDER — ENOXAPARIN SODIUM 30 MG/0.3ML IJ SOSY
30.0000 mg | PREFILLED_SYRINGE | INTRAMUSCULAR | Status: DC
  Administered 2023-07-31: 30 mg via SUBCUTANEOUS
  Filled 2023-07-27 (×5): qty 0.3

## 2023-07-27 MED ORDER — MOMETASONE FURO-FORMOTEROL FUM 100-5 MCG/ACT IN AERO
2.0000 | INHALATION_SPRAY | Freq: Two times a day (BID) | RESPIRATORY_TRACT | Status: DC
  Administered 2023-07-27 – 2023-08-01 (×11): 2 via RESPIRATORY_TRACT
  Filled 2023-07-27: qty 8.8

## 2023-07-27 MED ORDER — ACETAMINOPHEN 650 MG RE SUPP: 650.0000 mg | Freq: Four times a day (QID) | RECTAL | Status: DC | PRN

## 2023-07-27 MED ORDER — BUSPIRONE HCL 10 MG PO TABS
10.0000 mg | ORAL_TABLET | Freq: Two times a day (BID) | ORAL | Status: DC
  Administered 2023-07-27 – 2023-08-01 (×11): 10 mg via ORAL
  Filled 2023-07-27 (×11): qty 1

## 2023-07-27 MED ORDER — METHYLPREDNISOLONE SODIUM SUCC 40 MG IJ SOLR
40.0000 mg | Freq: Once | INTRAMUSCULAR | Status: AC
  Administered 2023-07-27: 40 mg via INTRAVENOUS
  Filled 2023-07-27: qty 1

## 2023-07-27 MED ORDER — ACETAMINOPHEN 325 MG PO TABS
650.0000 mg | ORAL_TABLET | Freq: Four times a day (QID) | ORAL | Status: DC | PRN
  Administered 2023-07-28 – 2023-07-31 (×2): 650 mg via ORAL
  Filled 2023-07-27 (×2): qty 2

## 2023-07-27 MED ORDER — ADULT MULTIVITAMIN W/MINERALS CH
1.0000 | ORAL_TABLET | Freq: Every day | ORAL | Status: DC
  Administered 2023-07-27 – 2023-08-01 (×6): 1 via ORAL
  Filled 2023-07-27 (×6): qty 1

## 2023-07-27 MED ORDER — OXYCODONE HCL 5 MG PO TABS
5.0000 mg | ORAL_TABLET | Freq: Two times a day (BID) | ORAL | Status: DC | PRN
  Administered 2023-07-27 – 2023-08-01 (×8): 5 mg via ORAL
  Filled 2023-07-27 (×8): qty 1

## 2023-07-27 MED ORDER — MAGNESIUM SULFATE IN D5W 1-5 GM/100ML-% IV SOLN
1.0000 g | Freq: Once | INTRAVENOUS | Status: AC
  Administered 2023-07-27: 1 g via INTRAVENOUS
  Filled 2023-07-27: qty 100

## 2023-07-27 MED ORDER — ALBUTEROL SULFATE (2.5 MG/3ML) 0.083% IN NEBU: 2.5000 mg | INHALATION_SOLUTION | RESPIRATORY_TRACT | Status: DC | PRN

## 2023-07-27 MED ORDER — IPRATROPIUM-ALBUTEROL 0.5-2.5 (3) MG/3ML IN SOLN
3.0000 mL | Freq: Four times a day (QID) | RESPIRATORY_TRACT | Status: DC
  Administered 2023-07-27 – 2023-07-28 (×4): 3 mL via RESPIRATORY_TRACT
  Filled 2023-07-27 (×4): qty 3
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
## 2023-07-27 NOTE — ED Notes (Addendum)
Informed that pt was being referred out by SW to [facility] for TTS assessment. When [facility] attempted to perform TTS assessment a different pt was utilizing TTS cart, informed by Dahlia Client [facility] Tele Coordinator that psychiatric provider has a very tight schedule and will now no longer be able to perform this pt assessment at this time. Awaiting further updates from [facility] regarding time of pt assessment. TJ charge nurse and Dr. Nicanor Alcon made aware of delay.
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
## 2023-07-27 NOTE — H&P (Signed)
History and Physical    Patient: Jennifer Mcdaniel ZOX:[id number] DOB: 09-05-1961 DOA: 07/26/2023 DOS: the patient was seen and examined on 07/27/2023 PCP: Karle Plumber, MD  Patient coming from: Home  Chief Complaint:  Chief Complaint  Patient presents with   Suicidal   Altered Mental Status   HPI: Jennifer Mcdaniel is a 62 y.o. female with medical history significant of COPD, on home oxygen, vitamin D deficiency, tobacco use disorder, sepsis, scoliosis, OCD, depression, ADHD, chronic respiratory failure on home oxygen, emphysema who presented to the emergency department brought by her husband due to change in memory and suicidal thoughts.  She was seen by psychiatry. She does not need inpatient therapy.  They have recommended to continue her regular medications and to taper off benzodiazepines.  She has had significant anxiety now.  She stated that this is related to her deteriorating physical condition and "not being on the same page with her husband". However, the patient has been more dyspneic recently associated with occasionally productive cough and wheezing. He denied fever, chills, rhinorrhea, sore throat, wheezing or hemoptysis. No chest pain, palpitations, diaphoresis, PND, orthopnea or pitting edema of the lower extremities. No abdominal pain, nausea, emesis, diarrhea, constipation, melena or hematochezia.  No flank pain, dysuria, frequency or hematuria.  No polyuria, polydipsia, polyphagia or blurred vision.   Lab work: UDS was positive for benzodiazepines.  Urinalysis was normal.  CBC showed a white count of 18.0, hemoglobin 11.5 g/dL platelets 811.  CMP showed chloride 94 mmol/L, but the rest of the electrolytes and renal function were unremarkable after calcium correction.  Glucose on 119 creatinine less than 0.30 mg/dL.  LFTs were normal with the exception of an albumin level of 3.2 g/dL.  Imaging: Portable 1 view chest radiograph with no active disease.  CT head without  contrast with no acute intracranial normality.  There was chronic small vessel ischemia.  ED course: Initial vital signs were temperature 97.9 F, pulse 77, respiration 18, BP 114/60 mmHg O2 sat 100% on nasal cannula oxygen.  She received a DuoNeb, 2 puffs of albuterol, methocarbamol 500 mg p.o. x 1 and was seen by psychiatry.   Review of Systems: As mentioned in the history of present illness. All other systems reviewed and are negative. Past Medical History:  Diagnosis Date   Anxiety    Depression    Pulmonary emphysema (HCC)    Past Surgical History:  Procedure Laterality Date   BACK SURGERY     Social History:  reports that she has been smoking cigarettes. She has quit using smokeless tobacco. She reports current alcohol use. She reports that she does not use drugs.  No Known Allergies  History reviewed. No pertinent family history.  Prior to Admission medications   Medication Sig Start Date End Date Taking? Authorizing Provider  ALPRAZolam Prudy Feeler) 0.5 MG tablet Take 0.5 mg by mouth 4 (four) times daily as needed for anxiety or sleep. 02/08/20  Yes [provider]  Aspirin-Acetaminophen-Caffeine (GOODY HEADACHE PO) Take 1 packet by mouth daily as needed (headache/pain).   Yes [provider]  Budeson-Glycopyrrol-Formoterol (BREZTRI AEROSPHERE) 160-9-4.8 MCG/ACT AERO Inhale 2 puffs into the lungs 2 (two) times daily.   Yes [provider]  buPROPion (WELLBUTRIN XL) 300 MG 24 hr tablet Take 300 mg by mouth daily. 07/28/22  Yes [provider]  busPIRone (BUSPAR) 10 MG tablet Take 10 mg by mouth 2 (two) times daily. 07/12/23  Yes [provider]  DULoxetine (CYMBALTA) 60 MG  capsule Take 60 mg by mouth 2 (two) times daily. 01/17/20  Yes [provider]  Multiple Vitamin (MULTIVITAMIN WITH MINERALS) TABS tablet Take 1 tablet by mouth daily.   Yes [provider]  naloxone (NARCAN) 0.4 MG/ML injection Inject 1 mL (0.4 mg total)  into the vein as needed. 09/29/21  Yes Darlin Drop, DO  oxyCODONE (OXY IR/ROXICODONE) 5 MG immediate release tablet Take 5 mg by mouth every 12 (twelve) hours as needed for breakthrough pain or severe pain (pain score 7-10). 10/06/22  Yes [provider]  tiZANidine (ZANAFLEX) 4 MG tablet Take 4 mg by mouth in the morning and at bedtime. 10/25/19  Yes [provider]  XTAMPZA ER 13.5 MG C12A Take 1 capsule by mouth 2 (two) times daily. 07/18/23  Yes [provider]  albuterol (VENTOLIN HFA) 108 (90 Base) MCG/ACT inhaler Inhale 2 puffs into the lungs every 4 (four) hours as needed for wheezing or shortness of breath.  01/12/20   [provider]    Physical Exam: Vitals:   07/26/23 1858 07/26/23 1905 07/26/23 2224 07/27/23 0705  BP:  114/60 111/68 (!) 102/90  Pulse:  77 80 78  Resp:  18 16 16   Temp:  97.9 F (36.6 C) 98.1 F (36.7 C) 97.6 F (36.4 C)  TempSrc:  Oral Oral   SpO2:  100% 91% 96%  Weight: 37.2 kg     Height: 5\' 6"  (1.676 m)      Physical Exam Vitals and nursing note reviewed.  Constitutional:      General: She is awake. She is not in acute distress.    Appearance: Normal appearance. She is ill-appearing.  HENT:     Head: Normocephalic.     Nose: No rhinorrhea.     Mouth/Throat:     Mouth: Mucous membranes are moist.  Eyes:     General: No scleral icterus.    Pupils: Pupils are equal, round, and reactive to light.  Neck:     Vascular: No JVD.  Cardiovascular:     Rate and Rhythm: Normal rate and regular rhythm.     Heart sounds: S1 normal and S2 normal.  Pulmonary:     Breath sounds: Wheezing present. No rhonchi or rales.  Abdominal:     General: Bowel sounds are normal.     Palpations: Abdomen is soft.  Musculoskeletal:     Cervical back: Neck supple.     Thoracic back: Deformity and tenderness present. Decreased range of motion.     Lumbar back: Tenderness present. Decreased range of motion.     Right lower leg: No edema.      Left lower leg: No edema.  Skin:    General: Skin is warm and dry.  Neurological:     General: No focal deficit present.     Mental Status: She is alert and oriented to person, place, and time.  Psychiatric:        Mood and Affect: Mood normal.        Behavior: Behavior normal. Behavior is cooperative.     Data Reviewed:  Results are pending, will review when available.  Assessment and Plan: Principal Problem:   COPD exacerbation (HCC) Superimposed on:   Chronic respiratory failure (HCC) Observation/telemetry Continue supplemental oxygen. Methylprednisolone 40 mg IVP x1. Followed by prednisone 40 mg p.o. daily in a.m. Scheduled and as needed bronchodilators. Tobacco cessation advised. Follow-up CBC and chemistry in the morning.   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 Consider nutritional services evaluation. Follow-up albumin level.    Depression Continue current meds per psychiatry recommendation. -Bupropion 300 mg p.o. daily. -Buspirone 10 mg p.o. twice daily. -Duloxetine 60 mg p.o. twice daily.    Attention deficit hyperactivity disorder (ADHD) Given anxiety, will hold on treatment. Follow-up with behavioral health.    Tobacco use disorder Tobacco cessation advised. Nicotine replacement therapy ordered.    Normocytic anemia Monitor hematocrit hemoglobin.    Thrombocytosis In the setting of anemia. Follow-up platelet count.    Chronic pain syndrome Continue Xtampza or formulary equivalent. Continue oxycodone as needed. Also on duloxetine as above.     Advance Care Planning:   Code Status: Full Code   Consults: Psychiatry.  Family Communication:   Severity of Illness: The appropriate patient status for this patient is OBSERVATION. Observation status is judged to be reasonable and necessary in order to provide the required  intensity of service to ensure the patient's safety. The patient's presenting symptoms, physical exam findings, and initial radiographic and laboratory data in the context of their medical condition is felt to place them at decreased risk for further clinical deterioration. Furthermore, it is anticipated that the patient will be medically stable for discharge from the hospital within 2 midnights of admission.   Author: Bobette Mo, MD 07/27/2023 8:30 AM  For on call review [email or web address].   This document was prepared using Dragon voice recognition software and may contain some unintended transcription errors.

## 2023-07-27 NOTE — ED Notes (Signed)
ED TO INPATIENT HANDOFF REPORT  ED Nurse Name and Phone #: Jerline Pain Name/Age/Gender Jennifer Mcdaniel 62 y.o. female Room/Bed: WA09/WA09  Code Status   Code Status: Full Code  Home/SNF/Other Nursing Home Patient oriented to: self, place, time, and situation Is this baseline? Yes   Triage Complete: Triage complete  Chief Complaint COPD exacerbation (HCC) [J44.1]  Triage Note Per EMS  Suicidal Husband wants psych eval AMS Over last week more forgetful    Allergies No Known Allergies  Level of Care/Admitting Diagnosis ED Disposition     ED Disposition  Admit   Condition  --   Comment  Hospital Area: [facility] [[id number]]  Level of Care: Med-Surg [16]  May place patient in observation at [facility] or [facility] if equivalent level of care is available:: No  Covid Evaluation: Asymptomatic - no recent exposure (last 10 days) testing not required  Diagnosis: COPD exacerbation [facility]) [[id number]]  Admitting Physician: Bobette Mo [[id number]]  Attending Physician: Bobette Mo [[id number]]          B Medical/Surgery History Past Medical History:  Diagnosis Date   Anxiety    Depression    Pulmonary emphysema (HCC)    Tobacco use disorder 03/03/2013   Vitamin D deficiency 03/03/2013   Past Surgical History:  Procedure Laterality Date   BACK SURGERY       A IV Location/Drains/Wounds Patient Lines/Drains/Airways Status     Active Line/Drains/Airways     Name Placement date Placement time Site Days   Peripheral IV 07/26/23 20 G Right Antecubital 07/26/23  2108  Antecubital  1            Intake/Output Last 24 hours  Intake/Output Summary (Last 24 hours) at 07/27/2023 1127 Last data filed at 07/27/2023 1007 Gross per 24 hour  Intake 60 ml  Output --  Net 60 ml    Labs/Imaging Results for orders placed or performed during the hospital encounter of 07/26/23 (from the past 48 hour(s))  Urine rapid drug screen  (hosp performed)     Status: Abnormal   Collection Time: 07/26/23  9:04 PM  Result Value Ref Range   Opiates NONE DETECTED NONE DETECTED   Cocaine NONE DETECTED NONE DETECTED   Benzodiazepines POSITIVE (A) NONE DETECTED   Amphetamines NONE DETECTED NONE DETECTED   Tetrahydrocannabinol NONE DETECTED NONE DETECTED   Barbiturates NONE DETECTED NONE DETECTED    Comment: (NOTE) DRUG SCREEN FOR MEDICAL PURPOSES ONLY.  IF CONFIRMATION IS NEEDED FOR ANY PURPOSE, NOTIFY LAB WITHIN 5 DAYS.  LOWEST DETECTABLE LIMITS FOR URINE DRUG SCREEN Drug Class                     Cutoff (ng/mL) Amphetamine and metabolites    1000 Barbiturate and metabolites    200 Benzodiazepine                 200 Opiates and metabolites        300 Cocaine and metabolites        300 THC                            50 Performed at [facility], [address], [place], [place] [address]   Urinalysis, Routine w reflex microscopic -Urine, Clean Catch     Status: None   Collection Time: 07/26/23  9:04 PM  Result Value Ref Range   Color, Urine  YELLOW YELLOW   APPearance CLEAR CLEAR   Specific Gravity, Urine 1.016 1.005 - 1.030   pH 7.0 5.0 - 8.0   Glucose, UA NEGATIVE NEGATIVE mg/dL   Hgb urine dipstick NEGATIVE NEGATIVE   Bilirubin Urine NEGATIVE NEGATIVE   Ketones, ur NEGATIVE NEGATIVE mg/dL   Protein, ur NEGATIVE NEGATIVE mg/dL   Nitrite NEGATIVE NEGATIVE   Leukocytes,Ua NEGATIVE NEGATIVE    Comment: Performed at [facility], [address], [place], [place] [address]  Comprehensive metabolic panel     Status: Abnormal   Collection Time: 07/26/23  9:05 PM  Result Value Ref Range   Sodium 135 135 - 145 mmol/L   Potassium 4.0 3.5 - 5.1 mmol/L   Chloride 94 (L) 98 - 111 mmol/L   CO2 29 22 - 32 mmol/L   Glucose, Bld 101 (H) 70 - 99 mg/dL    Comment: Glucose reference range applies only to samples taken after fasting for at least 8 hours.   BUN 8 8 - 23 mg/dL   Creatinine,  Ser <5.46 (L) 0.44 - 1.00 mg/dL   Calcium 8.7 (L) 8.9 - 10.3 mg/dL   Total Protein 7.4 6.5 - 8.1 g/dL   Albumin 3.2 (L) 3.5 - 5.0 g/dL   AST 16 15 - 41 U/L   ALT 15 0 - 44 U/L   Alkaline Phosphatase 72 38 - 126 U/L   Total Bilirubin 0.6 0.3 - 1.2 mg/dL   GFR, Estimated NOT CALCULATED >60 mL/min    Comment: (NOTE) Calculated using the CKD-EPI Creatinine Equation (2021)    Anion gap 12 5 - 15    Comment: Performed at [facility], [address], [place], [place] [address]  Ethanol     Status: None   Collection Time: 07/26/23  9:05 PM  Result Value Ref Range   Alcohol, Ethyl (B) <10 <10 mg/dL    Comment: (NOTE) Lowest detectable limit for serum alcohol is 10 mg/dL.  For medical purposes only. Performed at [facility], [address] [place], [place] [address]   CBC with Diff     Status: Abnormal   Collection Time: 07/26/23  9:05 PM  Result Value Ref Range   WBC 18.0 (H) 4.0 - 10.5 K/uL   RBC 3.75 (L) 3.87 - 5.11 MIL/uL   Hemoglobin 11.5 (L) 12.0 - 15.0 g/dL   HCT 18.2 (L) 99.3 - 71.6 %   MCV 95.5 80.0 - 100.0 fL   MCH 30.7 26.0 - 34.0 pg   MCHC 32.1 30.0 - 36.0 g/dL   RDW 96.7 89.3 - 81.0 %   Platelets 443 (H) 150 - 400 K/uL   nRBC 0.0 0.0 - 0.2 %   Neutrophils Relative % 89 %   Neutro Abs 15.8 (H) 1.7 - 7.7 K/uL   Lymphocytes Relative 7 %   Lymphs Abs 1.3 0.7 - 4.0 K/uL   Monocytes Relative 4 %   Monocytes Absolute 0.8 0.1 - 1.0 K/uL   Eosinophils Relative 0 %   Eosinophils Absolute 0.0 0.0 - 0.5 K/uL   Basophils Relative 0 %   Basophils Absolute 0.1 0.0 - 0.1 K/uL   Immature Granulocytes 0 %   Abs Immature Granulocytes 0.05 0.00 - 0.07 K/uL    Comment: Performed at [facility], [address], [place], [place] [address]   DG Chest 1 View  Result Date: 07/26/2023 CLINICAL DATA:  Chest pain EXAM: CHEST  1 VIEW COMPARISON:  Chest x-ray 09/25/2021  FINDINGS: The lungs are clear. There is no pleural effusion  or pneumothorax. The cardiomediastinal silhouette is within normal limits. Dextroconvex scoliosis and fixation rods are again noted. IMPRESSION: No active disease. Electronically Signed   By: Darliss Cheney M.D.   On: 07/26/2023 22:55   CT Head Wo Contrast  Result Date: 07/26/2023 CLINICAL DATA:  Memory loss EXAM: CT HEAD WITHOUT CONTRAST TECHNIQUE: Contiguous axial images were obtained from the base of the skull through the vertex without intravenous contrast. RADIATION DOSE REDUCTION: This exam was performed according to the departmental dose-optimization program which includes automated exposure control, adjustment of the mA and/or kV according to patient size and/or use of iterative reconstruction technique. COMPARISON:  09/25/2021 FINDINGS: Brain: No mass, hemorrhage or extra-axial collection. There is hypoattenuation of the white matter. CSF spaces are normal. Vascular: No hyperdense vessel or unexpected calcification. Skull: Normal Sinuses/Orbits: Paranasal sinuses are clear. No mastoid effusion. Normal orbits. Other: None IMPRESSION: 1. No acute intracranial abnormality. 2. Chronic small vessel ischemia. Electronically Signed   By: Deatra Robinson M.D.   On: 07/26/2023 22:44    Pending Labs Unresulted Labs (From admission, onward)     Start     Ordered   07/28/23 0500  HIV Antibody (routine testing w rflx)  (HIV Antibody (Routine testing w reflex) panel)  Tomorrow morning,   R        07/27/23 0855   07/28/23 0500  CBC  Tomorrow morning,   R        07/27/23 0855   07/28/23 0500  Comprehensive metabolic panel  Tomorrow morning,   R        07/27/23 0855            Vitals/Pain Today's Vitals   07/26/23 1905 07/26/23 2224 07/27/23 0705 07/27/23 1106  BP: 114/60 111/68 (!) 102/90   Pulse: 77 80 78   Resp: 18 16 16    Temp: 97.9 F (36.6 C) 98.1 F (36.7 C) 97.6 F (36.4 C)   TempSrc: Oral Oral    SpO2: 100% 91% 96% 92%  Weight:      Height:      PainSc:        Isolation  Precautions No active isolations  Medications Medications  nicotine (NICODERM CQ - dosed in mg/24 hours) patch 21 mg (21 mg Transdermal Patch Applied 07/26/23 2218)  albuterol (VENTOLIN HFA) 108 (90 Base) MCG/ACT inhaler 1-2 puff (2 puffs Inhalation Given 07/27/23 0852)  mometasone-formoterol (DULERA) 100-5 MCG/ACT inhaler 2 puff (2 puffs Inhalation Given 07/27/23 0851)  busPIRone (BUSPAR) tablet 10 mg (10 mg Oral Given 07/27/23 0943)  DULoxetine (CYMBALTA) DR capsule 60 mg (60 mg Oral Given 07/27/23 0943)  multivitamin with minerals tablet 1 tablet (1 tablet Oral Given 07/27/23 0943)  oxyCODONE (Oxy IR/ROXICODONE) immediate release tablet 5 mg (has no administration in time range)  naloxone [facility]) injection 0.4 mg (has no administration in time range)  buPROPion (WELLBUTRIN XL) 24 hr tablet 300 mg (300 mg Oral Given 07/27/23 0944)  enoxaparin (LOVENOX) injection 30 mg (has no administration in time range)  acetaminophen (TYLENOL) tablet 650 mg (has no administration in time range)    Or  acetaminophen (TYLENOL) suppository 650 mg (has no administration in time range)  ondansetron (ZOFRAN) tablet 4 mg (has no administration in time range)    Or  ondansetron (ZOFRAN) injection 4 mg (has no administration in time range)  ipratropium-albuterol (DUONEB) 0.5-2.5 (3) MG/3ML nebulizer solution 3 mL (3 mLs Nebulization Given 07/27/23 1106)  ALPRAZolam (  XANAX) tablet 0.5 mg (0.5 mg Oral Given 07/27/23 0943)  predniSONE (DELTASONE) tablet 40 mg (has no administration in time range)  ALPRAZolam Prudy Feeler) tablet 0.5 mg (0.5 mg Oral Given 07/26/23 2218)  methocarbamol (ROBAXIN) tablet 500 mg (500 mg Oral Given 07/26/23 2256)  ipratropium-albuterol (DUONEB) 0.5-2.5 (3) MG/3ML nebulizer solution 3 mL (3 mLs Nebulization Given 07/26/23 2311)  albuterol (VENTOLIN HFA) 108 (90 Base) MCG/ACT inhaler 1-2 puff (2 puffs Inhalation Given 07/27/23 0612)  methylPREDNISolone sodium succinate (SOLU-MEDROL) 40 mg/mL  injection 40 mg (40 mg Intravenous Given 07/27/23 0944)  magnesium sulfate IVPB 1 g 100 mL (1 g Intravenous New Bag/Given 07/27/23 0948)    Mobility walks with person assist     Focused Assessments Failure to thrive   R Recommendations: See Admitting Provider Note  Report given to:   Additional Notes: pt is aox4, 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

## 2023-07-27 NOTE — ED Provider Notes (Signed)
Patient care was taken over from by her provider.  Patient had presented to the ED with worsening anxiety, fatigue and some vague suicidal ideations.  She was evaluated by psychiatry and not deemed to need inpatient psychiatric treatment.  She does use a lot of benzos.  She also was recently diagnosed with lung cancer and is noted to be cachectic.  She says she is not eating and drinking well.  She does have COPD and is oxygen dependent.  She says she has worsening cough and shortness of breath.  She is noted to have a ongoing cough on my exam.  Unclear of how acute or chronic this is.  She is maintaining oxygen saturations at her baseline 3 to 5 L/min.  Chest x-ray does not reveal any obvious pneumonia.  I did ask her about possibility of assisted living placement and she declines.  I asked her if she feels safe at home and she says she does.  However she feels like she is very rundown and has not been eating and drinking well and feels like she needs to stay overnight in the hospital to "get stronger".  She says she has not been able to get around well due to her general weakness.  Will discuss with the hospitalist appropriateness for admission.  Discussed with Dr. Robb Matar who will admit the pt.   Rolan Bucco, MD 07/27/23 0830

## 2023-07-27 NOTE — Plan of Care (Signed)
  Problem: Education: Goal: Knowledge of General Education information will improve Description: Including pain rating scale, medication(s)/side effects and non-pharmacologic comfort measures Outcome: Progressing   Problem: Nutrition: Goal: Adequate nutrition will be maintained Outcome: Progressing   Problem: Coping: Goal: Level of anxiety will decrease Outcome: Progressing   

## 2023-07-28 DIAGNOSIS — J441 Chronic obstructive pulmonary disease with (acute) exacerbation: Secondary | ICD-10-CM | POA: Diagnosis not present

## 2023-07-28 LAB — HIV ANTIBODY (ROUTINE TESTING W REFLEX): HIV Screen 4th Generation wRfx: NONREACTIVE

## 2023-07-28 LAB — CBC
HCT: 37.5 % (ref 36.0–46.0)
Hemoglobin: 12 g/dL (ref 12.0–15.0)
MCH: 29.9 pg (ref 26.0–34.0)
MCHC: 32 g/dL (ref 30.0–36.0)
MCV: 93.5 fL (ref 80.0–100.0)
Platelets: 350 10*3/uL (ref 150–400)
RBC: 4.01 MIL/uL (ref 3.87–5.11)
RDW: 12.7 % (ref 11.5–15.5)
WBC: 11.5 10*3/uL — ABNORMAL HIGH (ref 4.0–10.5)
nRBC: 0 % (ref 0.0–0.2)

## 2023-07-28 LAB — COMPREHENSIVE METABOLIC PANEL
ALT: 15 U/L (ref 0–44)
AST: 17 U/L (ref 15–41)
Albumin: 3.4 g/dL — ABNORMAL LOW (ref 3.5–5.0)
Alkaline Phosphatase: 78 U/L (ref 38–126)
Anion gap: 10 (ref 5–15)
BUN: 11 mg/dL (ref 8–23)
CO2: 31 mmol/L (ref 22–32)
Calcium: 9.2 mg/dL (ref 8.9–10.3)
Chloride: 94 mmol/L — ABNORMAL LOW (ref 98–111)
Creatinine, Ser: <0.3 mg/dL — ABNORMAL LOW (ref 0.44–1.00)
Glucose, Bld: 97 mg/dL (ref 70–99)
Potassium: 3.5 mmol/L (ref 3.5–5.1)
Sodium: 135 mmol/L (ref 135–145)
Total Bilirubin: 0.5 mg/dL (ref 0.3–1.2)
Total Protein: 8 g/dL (ref 6.5–8.1)

## 2023-07-28 MED ORDER — IPRATROPIUM-ALBUTEROL 0.5-2.5 (3) MG/3ML IN SOLN
3.0000 mL | Freq: Three times a day (TID) | RESPIRATORY_TRACT | Status: DC
  Administered 2023-07-28 – 2023-08-01 (×13): 3 mL via RESPIRATORY_TRACT
  Filled 2023-07-28 (×12): qty 3

## 2023-07-28 NOTE — Plan of Care (Signed)
  Problem: Education: Goal: Knowledge of General Education information will improve Description: Including pain rating scale, medication(s)/side effects and non-pharmacologic comfort measures Outcome: Progressing   Problem: Clinical Measurements: Goal: Ability to maintain clinical measurements within normal limits will improve Outcome: Progressing Goal: Respiratory complications will improve Outcome: Progressing   Problem: Pain Management: Goal: General experience of comfort will improve Outcome: Progressing   Problem: Safety: Goal: Ability to remain free from injury will improve Outcome: Progressing

## 2023-07-28 NOTE — Progress Notes (Signed)
[facility] Progress Note Patient: FALAN BLADE ZHY:[id number] DOB: 04/26/1961 DOA: 07/26/2023  DOS: the patient was seen and examined on 07/28/2023  Brief hospital course: PMH of COPD, chronic respiratory failure on 5 LPM, depression, anxiety, ADHD, active smoker presented to the hospital with complaints of confusion and suicidal thoughts. Psychiatry was consulted in ED all the way psychiatry felt that the patient does not have any significant risk to harm herself and therefore recommends no indication for inpatient psychiatric evaluation. There is some concern that the patient does not have a safe place to go.  Social worker working on the case.  Assessment and Plan: Chronic COPD. Chronic respiratory failure. Does not appear to have any severe exacerbation right now. Oxygenation is at his baseline. Chest x-ray unremarkable. Patient denies any recent fever or chills. At present would continue prednisone taper and nebulization therapy. No indication for further escalation of care for now.  Depression. Anxiety. ADHD. Concern for home safety. Please see note from psychiatry as well as nursing staff. Patient initially presented with a suicidal ideation although patient does not have any plan per psychiatry. Patient currently does not have any suicidal ideation as well. Psychiatry felt that the patient can be safely discharged back to her prior environment but does not require inpatient psychiatric stay. Due to her ongoing anxiety we will consult psychiatry again for further assistance as originally psychiatry recommended to taper her Xanax. Will await recommendation. Patient reported concern for safety.  TOC working on the case.  Active smoker. Cessation recommended. Nicotine replacement patch.  Lung nodule. 12 mm lung nodule. Monitoring recommended by her primary pulmonologist.  Underweight. Adult failure to thrive. Cachexia. Placing the patient at high risk of poor  outcome. For now we will monitor.  Chronic pain syndrome. On Xtampza. Currently on oxycodone. Also on Cymbalta. Patient was actually on Lyrica 150 mg 3 times daily which is now tapered off by the patient.  Subjective: No nausea no vomiting no fever no chills.  When asked about whether she feels safe to go home today, patient tells me that she would like to have some more family members involved in "this" and would like to go home after couple of days.  Physical Exam: General: in Mild distress, No Rash Cardiovascular: S1 and S2 Present, No Murmur Respiratory: Good respiratory effort, Bilateral Air entry present. No Crackles, No wheezes Abdomen: Bowel Sound present, No tenderness Extremities: No edema Neuro: Alert and oriented x3, no new focal deficit  Data Reviewed: I have Reviewed nursing notes, Vitals, and Lab results. Since last encounter, pertinent lab results CBC and BMP   . I have ordered test including    .   Disposition: Status is: Observation  enoxaparin (LOVENOX) injection 30 mg Start: 07/27/23 2200   Family Communication: No one at bedside Level of care: Med-Surg   Vitals:   07/27/23 2244 07/28/23 0241 07/28/23 0906 07/28/23 1204  BP: 133/64 (!) 143/78  (!) 140/78  Pulse:  88  97  Resp: 19 17  16   Temp: 98.2 F (36.8 C) 97.8 F (36.6 C)  98.7 F (37.1 C)  TempSrc: Oral Oral  Oral  SpO2: 92% 96% 98% 96%  Weight:      Height:         Author: Lynden Oxford, MD 07/28/2023 6:17 PM  Please look on [email or web address] to find out who is on call.

## 2023-07-28 NOTE — Plan of Care (Signed)
  Problem: Clinical Measurements: Goal: Will remain free from infection Outcome: Progressing Goal: Respiratory complications will improve Outcome: Progressing   Problem: Activity: Goal: Risk for activity intolerance will decrease Outcome: Progressing   Problem: Nutrition: Goal: Adequate nutrition will be maintained Outcome: Progressing   Problem: Pain Management: Goal: General experience of comfort will improve Outcome: Progressing   Problem: Safety: Goal: Ability to remain free from injury will improve Outcome: Progressing   Problem: Skin Integrity: Goal: Risk for impaired skin integrity will decrease Outcome: Progressing

## 2023-07-28 NOTE — Care Management Obs Status (Signed)
MEDICARE OBSERVATION STATUS NOTIFICATION   Patient Details  Name: Jennifer Mcdaniel MRN: [id number] Date of Birth: 01/21/1961   Medicare Observation Status Notification Given:  Yes    Otelia Santee, LCSW 07/28/2023, 11:46 AM
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
## 2023-07-28 NOTE — Progress Notes (Signed)
Pt has been admitted medically. Inpatient Psych consult team to follow for pt's psych needs. This CSW will not remove pt from TTS/ [facility] shift report.   Maryjean Ka, MSW, [facility] 07/28/2023 7:29 AM

## 2023-07-28 NOTE — Progress Notes (Signed)
Husband called regarding [facility] and their information,   Lupita Leash  [phone]

## 2023-07-29 DIAGNOSIS — J441 Chronic obstructive pulmonary disease with (acute) exacerbation: Secondary | ICD-10-CM | POA: Diagnosis not present

## 2023-07-29 MED ORDER — TIZANIDINE HCL 4 MG PO TABS
4.0000 mg | ORAL_TABLET | Freq: Once | ORAL | Status: AC
  Administered 2023-07-29: 4 mg via ORAL
  Filled 2023-07-29: qty 1

## 2023-07-29 MED ORDER — GABAPENTIN 100 MG PO CAPS
100.0000 mg | ORAL_CAPSULE | Freq: Three times a day (TID) | ORAL | Status: DC | PRN
  Administered 2023-07-29 – 2023-08-01 (×5): 100 mg via ORAL
  Filled 2023-07-29 (×5): qty 1

## 2023-07-29 MED ORDER — GABAPENTIN 100 MG PO CAPS: 100.0000 mg | ORAL_CAPSULE | Freq: Three times a day (TID) | ORAL | Status: DC

## 2023-07-29 NOTE — Evaluation (Signed)
Physical Therapy Evaluation Patient Details Name: Jennifer Mcdaniel MRN: [id number] DOB: Feb 24, 1961 Today's Date: 07/29/2023  History of Present Illness  Pt is 62 yo female brought to ED on 07/26/23 by spouse for change in memory and suicidal thoughts.  Pt has been seen by psychiatry and not felt to have significant risk of harm to self and not candidate for inpt psych hospital. Additionally, pt found to be more dyspneic than baseline and some concern for home safety - social work is involved.  Pt with hx including chronic resp failure on 3-5 L O2 at home, depression, anxiety, ADHD, active smoker, extensive back sx.  Clinical Impression  Pt admitted with above diagnosis. At baseline, pt reports could ambulate short community distances without AD.  She is on 3-5 L but tends to always increase to 5 L for activity.  Pt reports several falls but typically when she falls asleep on toilet (reports is wearing O2 to restroom).  Pt lives in 2 level home that she enters in basement and has to go up a flight of steps to main level, but then can stay on that level.   Today, pt ambulating 30' then 15' , limited by shortness of breath requiring rest break.  Pt does have mild unsteadiness without AD but declined RW use.  Pt currently with functional limitations due to the deficits listed below (see PT Problem List). Pt will benefit from acute skilled PT to increase their independence and safety with mobility to allow discharge.  From a mobility perspective, recommend HHPT at d/c.          If plan is discharge home, recommend the following: A little help with walking and/or transfers;A little help with bathing/dressing/bathroom;Assistance with cooking/housework;Help with stairs or ramp for entrance   Can travel by private vehicle        Equipment Recommendations None recommended by PT (has RW, declined rollator)  Recommendations for Other Services       Functional Status Assessment Patient has had a recent  decline in their functional status and demonstrates the ability to make significant improvements in function in a reasonable and predictable amount of time.     Precautions / Restrictions Precautions Precautions: ICD/Pacemaker      Mobility  Bed Mobility Overal bed mobility: Needs Assistance Bed Mobility: Supine to Sit, Sit to Supine     Supine to sit: Modified independent (Device/Increase time) Sit to supine: Modified independent (Device/Increase time)        Transfers Overall transfer level: Needs assistance Equipment used: None Transfers: Sit to/from Stand Sit to Stand: Contact guard assist                Ambulation/Gait Ambulation/Gait assistance: Contact guard assist Gait Distance (Feet): 30 Feet (30' then 15') Assistive device: None Gait Pattern/deviations: Step-to pattern, Decreased stride length Gait velocity: decreased     General Gait Details: Declined RW; had no LOB but fatigued easily and needed 1 sitting rest break  Stairs            Wheelchair Mobility     Tilt Bed    Modified Rankin (Stroke Patients Only)       Balance Overall balance assessment: Needs assistance Sitting-balance support: No upper extremity supported Sitting balance-Leahy Scale: Good     Standing balance support: No upper extremity supported Standing balance-Leahy Scale: Good  Pertinent Vitals/Pain Pain Assessment Pain Assessment: No/denies pain    Home Living Family/patient expects to be discharged to:: Private residence Living Arrangements: Spouse/significant other Available Help at Discharge: Family;Available 24 hours/day Type of Home: House Home Access: Level entry     Alternate Level Stairs-Number of Steps: 13 Home Layout: Two level;Bed/bath upstairs Home Equipment: Rolling Walker (2 wheels);Wheelchair - manual;Shower seat Additional Comments: 3-5 L home O2 baseline    Prior Function Prior Level of  Function : Needs assist             Mobility Comments: Pt independent with mobilty ; Could only ambulate short community distance; Reports couple falls per month (but typically when she falls asleep on toilet and falls; reports none when walking) ADLs Comments: Mod I for ADL (reports fatigues with bathing).  Family assist with IADLs, pt can do light IADLS  (microwave meals)     Extremity/Trunk Assessment   Upper Extremity Assessment Upper Extremity Assessment: Overall WFL for tasks assessed    Lower Extremity Assessment Lower Extremity Assessment: LLE deficits/detail;RLE deficits/detail RLE Deficits / Details: ROM WFL; MMT 4+/5 LLE Deficits / Details: ROM WFL; MMT 4+/5    Cervical / Trunk Assessment Cervical / Trunk Assessment: Kyphotic  Communication      Cognition Arousal: Alert Behavior During Therapy: WFL for tasks assessed/performed Overall Cognitive Status: Within Functional Limits for tasks assessed                                          General Comments General comments (skin integrity, edema, etc.): Cachetic appearance; Pt on 4 L O2 with sats 89%.  Placed on 6 L to ambulate (5 not option on tank), sats maintained 91% on 6L.  Returned to 4 L with sats 90%.   Pt educated on energy conservation, rest breaks, assist with stairs, and pursed lip breathing   Exercises     Assessment/Plan    PT Assessment Patient needs continued PT services  PT Problem List Decreased strength;Cardiopulmonary status limiting activity;Decreased range of motion;Decreased activity tolerance;Decreased balance;Decreased mobility;Decreased knowledge of use of DME       PT Treatment Interventions DME instruction;Therapeutic exercise;Gait training;Stair training;Functional mobility training;Therapeutic activities;Patient/family education;Balance training;Neuromuscular re-education    PT Goals (Current goals can be found in the Care Plan section)  Acute Rehab PT  Goals Patient Stated Goal: maintain mobility as long as possible PT Goal Formulation: With patient Time For Goal Achievement: 08/12/23 Potential to Achieve Goals: Fair    Frequency Min 1X/week     Co-evaluation               AM-PAC PT "6 Clicks" Mobility  Outcome Measure Help needed turning from your back to your side while in a flat bed without using bedrails?: A Little Help needed moving from lying on your back to sitting on the side of a flat bed without using bedrails?: A Little Help needed moving to and from a bed to a chair (including a wheelchair)?: A Little Help needed standing up from a chair using your arms (e.g., wheelchair or bedside chair)?: A Little Help needed to walk in hospital room?: A Little Help needed climbing 3-5 steps with a railing? : A Little 6 Click Score: 18    End of Session Equipment Utilized During Treatment: Gait belt Activity Tolerance: Patient tolerated treatment well Patient left: in bed;with call bell/phone within reach Nurse Communication: Mobility  status PT Visit Diagnosis: Other abnormalities of gait and mobility (R26.89);Muscle weakness (generalized) (M62.81)    Time: 1610-9604 PT Time Calculation (min) (ACUTE ONLY): 26 min   Charges:   PT Evaluation $PT Eval Low Complexity: 1 Low PT Treatments $Gait Training: 8-22 mins PT General Charges $$ ACUTE PT VISIT: 1 Visit         Anise Salvo, PT Acute Rehab Services [facility] [phone]   Rayetta Humphrey 07/29/2023, 11:30 AM
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
## 2023-07-29 NOTE — Evaluation (Signed)
Occupational Therapy Evaluation Patient Details Name: Jennifer Mcdaniel MRN: [id number] DOB: 31-Jul-1961 Today's Date: 07/29/2023   History of Present Illness Pt is 62 yo female brought to ED on 07/26/23 by spouse for change in memory and suicidal thoughts.  Pt has been seen by psychiatry and not felt to have significant risk of harm to self and not candidate for inpt psych hospital. Pt with hx including chronic resp failure on 3-5 L O2 at home, depression, anxiety, ADHD, active smoker, extensive back sx.   Clinical Impression   Pt seen today for OT eval. Reports IND at baseline for ADLs and mobility with no AD. On 5L O2 chronically. Several falls when she falls asleep on toilet. Pt overall very frail in appearance and reports she does not eat that much. Pt completes all tasks with supervision and light assist for O2 line. Functional mobility t/f bathroom no AD, toilet sit<> stand transfers and pericare, stands at sink to wash hands. Doffs/dons new gown seated/standing with pt good awareness of O2 line and asking for assist to tie gown in back. Sits to brush hair and wash face due to SOB. SpO2 on 5L, 95%> throughout eval. C/o 5/10 pain in back.Pt would benefit from skilled OT services to address noted impairments and functional limitations (see below for any additional details) in order to maximize safety and independence while minimizing falls risk and caregiver burden.  Recommending HHOT at hospital discharge.     If plan is discharge home, recommend the following: A little help with walking and/or transfers;A little help with bathing/dressing/bathroom;Assist for transportation    Functional Status Assessment  Patient has had a recent decline in their functional status and demonstrates the ability to make significant improvements in function in a reasonable and predictable amount of time.  Equipment Recommendations  None recommended by OT    Recommendations for Other Services Other  (comment)     Precautions / Restrictions Precautions Precautions: ICD/Pacemaker Restrictions Weight Bearing Restrictions: No      Mobility Bed Mobility Overal bed mobility: Needs Assistance Bed Mobility: Supine to Sit, Sit to Supine     Supine to sit: Supervision Sit to supine: Supervision        Transfers Overall transfer level: Needs assistance Equipment used: None Transfers: Sit to/from Stand Sit to Stand: Supervision                  Balance Overall balance assessment: Needs assistance Sitting-balance support: No upper extremity supported Sitting balance-Leahy Scale: Good     Standing balance support: No upper extremity supported Standing balance-Leahy Scale: Good                             ADL either performed or assessed with clinical judgement   ADL Overall ADL's : Needs assistance/impaired     Grooming: Wash/dry face;Wash/dry hands;Set up           Upper Body Dressing : Supervision/safety;Standing Upper Body Dressing Details (indicate cue type and reason): dons gown, good awareness of O2 line     Toilet Transfer: Supervision/safety   Toileting- Clothing Manipulation and Hygiene: Supervision/safety;Sit to/from stand       Functional mobility during ADLs: Supervision/safety General ADL Comments: Pt completes all tasks with supervision and light assist for O2 line. Functional mobility t/f bathroom no AD, toilet sit<> stand transfers and pericare, stands at sink to wash hands. Doffs/dons new gown seated/standing with pt good awareness of O2 line  and asking for assist to tie gown in back. Sits to brush hair and wash face due to SOB. SpO2 on 5L, 95%> throughout eval.      Pertinent Vitals/Pain Pain Assessment Pain Assessment: 0-10 Pain Score: 5  Pain Location: LBP Pain Descriptors / Indicators: Constant, Burning, Aching     Extremity/Trunk Assessment Upper Extremity Assessment Upper Extremity Assessment: Overall WFL for  tasks assessed   Lower Extremity Assessment Lower Extremity Assessment: Defer to PT evaluation;Overall WFL for tasks assessed RLE Deficits / Details: ROM WFL; MMT 4+/5 LLE Deficits / Details: ROM WFL; MMT 4+/5   Cervical / Trunk Assessment Cervical / Trunk Assessment: Kyphotic   Communication Communication Communication: No apparent difficulties   Cognition Arousal: Alert Behavior During Therapy: WFL for tasks assessed/performed Overall Cognitive Status: Within Functional Limits for tasks assessed                                       General Comments  Pt reporting chronic use of O2 at 5L, sats 95%> throughout eval            Home Living Family/patient expects to be discharged to:: Private residence Living Arrangements: Spouse/significant other Available Help at Discharge: Family;Available 24 hours/day Type of Home: House Home Access: Level entry     Home Layout: Two level;Bed/bath upstairs Alternate Level Stairs-Number of Steps: 13 Alternate Level Stairs-Rails: Right;Left;Can reach both Bathroom Shower/Tub: Walk-in shower;Tub/shower unit   Bathroom Toilet: Handicapped height     Home Equipment: Agricultural consultant (2 wheels);Wheelchair - manual;Shower seat   Additional Comments: 3-5 L home O2 baseline      Prior Functioning/Environment Prior Level of Function : Needs assist             Mobility Comments: Pt independent with mobilty ; Could only ambulate short community distance; Reports couple falls per month (but typically when she falls asleep on toilet and falls; reports none when walking) ADLs Comments: Mod I for ADL (reports fatigues with bathing).  Family assist with IADLs, pt can do light IADLS  (microwave meals)        OT Problem List: Cardiopulmonary status limiting activity;Decreased activity tolerance      OT Treatment/Interventions: Self-care/ADL training;Therapeutic exercise;Energy conservation;Therapeutic  activities;Patient/family education;Balance training    OT Goals(Current goals can be found in the care plan section) Acute Rehab OT Goals Patient Stated Goal: to go home OT Goal Formulation: With patient Time For Goal Achievement: 08/12/23 Potential to Achieve Goals: Good  OT Frequency: Min 1X/week       AM-PAC OT "6 Clicks" Daily Activity     Outcome Measure Help from another person eating meals?: None Help from another person taking care of personal grooming?: None Help from another person toileting, which includes using toliet, bedpan, or urinal?: A Little Help from another person bathing (including washing, rinsing, drying)?: A Little Help from another person to put on and taking off regular upper body clothing?: None Help from another person to put on and taking off regular lower body clothing?: A Little 6 Click Score: 21   End of Session Equipment Utilized During Treatment: Other (comment) Nurse Communication: Other (comment)  Activity Tolerance: Patient tolerated treatment well Patient left: in bed;with call bell/phone within reach;with bed alarm set  OT Visit Diagnosis: Other abnormalities of gait and mobility (R26.89);Unsteadiness on feet (R26.81);Muscle weakness (generalized) (M62.81)  Time: 0347-4259 OT Time Calculation (min): 29 min Charges:  OT General Charges $OT Visit: 1 Visit OT Evaluation $OT Eval Low Complexity: 1 Low OT Treatments $Self Care/Home Management : 8-22 mins  Jacqualine Weichel L. Faustina Gebert, OTR/L  07/29/23, 2:31 PM
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
## 2023-07-29 NOTE — TOC Progression Note (Signed)
Transition of Care [facility]) - Progression Note    Patient Details  Name: Jennifer Mcdaniel MRN: [id number] Date of Birth: 1961/07/11  [facility]) CM/SW Contact  Otelia Santee, LCSW Phone Number: 07/29/2023, 4:03 PM  Clinical Narrative:    Met with pt who shares she has spoken with her husband today and that they have agreed to some "terms." She says she plans to return to live with him at discharge however, says she plans to leave him if he breaks the terms. Pt shares she can stay with family if this happens. She says her family is aware of the situation. Pt recommended for [facility] PT/OT. Pt agreeable to home health services and denies having a preference. HHPT/OT arranged with [facility]. Will need HH orders prior to discharge.    Expected Discharge Plan: Home/Self Care Barriers to Discharge: No Barriers Identified  Expected Discharge Plan and Services In-house Referral: Clinical Social Work Discharge Planning Services: NA Post Acute Care Choice: NA Living arrangements for the past 2 months: Single Family Home                 DME Arranged: N/A DME Agency: NA       HH Arranged: PT, OT HH Agency: [facility] ([facility]) Date HH Agency Contacted: 07/29/23 Time HH Agency Contacted: 1603 Representative spoke with at [facility] Agency: Duwaine Maxin   Social Determinants of Health (SDOH) Interventions SDOH Screenings   Food Insecurity: No Food Insecurity (07/27/2023)  Housing: Low Risk  (07/27/2023)  Transportation Needs: Unmet Transportation Needs (07/27/2023)  Utilities: Not At Risk (07/27/2023)  Social Connections: Unknown (08/23/2022)   Received from [facility]  Tobacco Use: High Risk (07/27/2023)    Readmission Risk Interventions     No data to display

## 2023-07-29 NOTE — Plan of Care (Signed)
No acute events overnight. Problem: Education: Goal: Knowledge of General Education information will improve Description: Including pain rating scale, medication(s)/side effects and non-pharmacologic comfort measures Outcome: Progressing   Problem: Health Behavior/Discharge Planning: Goal: Ability to manage health-related needs will improve Outcome: Progressing   Problem: Clinical Measurements: Goal: Ability to maintain clinical measurements within normal limits will improve Outcome: Progressing Goal: Will remain free from infection Outcome: Progressing Goal: Diagnostic test results will improve Outcome: Progressing Goal: Respiratory complications will improve Outcome: Progressing Goal: Cardiovascular complication will be avoided Outcome: Progressing   Problem: Activity: Goal: Risk for activity intolerance will decrease Outcome: Progressing   Problem: Nutrition: Goal: Adequate nutrition will be maintained Outcome: Progressing   Problem: Coping: Goal: Level of anxiety will decrease Outcome: Progressing   Problem: Elimination: Goal: Will not experience complications related to bowel motility Outcome: Progressing Goal: Will not experience complications related to urinary retention Outcome: Progressing   Problem: Pain Management: Goal: General experience of comfort will improve Outcome: Progressing   Problem: Safety: Goal: Ability to remain free from injury will improve Outcome: Progressing   Problem: Skin Integrity: Goal: Risk for impaired skin integrity will decrease Outcome: Progressing

## 2023-07-30 DIAGNOSIS — J441 Chronic obstructive pulmonary disease with (acute) exacerbation: Secondary | ICD-10-CM | POA: Diagnosis not present

## 2023-07-30 LAB — GLUCOSE, CAPILLARY: Glucose-Capillary: 86 mg/dL (ref 70–99)

## 2023-07-30 MED ORDER — DOCUSATE SODIUM 100 MG PO CAPS
100.0000 mg | ORAL_CAPSULE | Freq: Every day | ORAL | Status: DC | PRN
  Administered 2023-07-31: 100 mg via ORAL
  Filled 2023-07-30: qty 1

## 2023-07-30 MED ORDER — GABAPENTIN 100 MG PO CAPS: 100.0000 mg | ORAL_CAPSULE | Freq: Two times a day (BID) | ORAL | 0 refills | Status: AC

## 2023-07-30 MED ORDER — PREDNISONE 10 MG PO TABS: ORAL_TABLET | ORAL | 0 refills | Status: AC

## 2023-07-30 MED ORDER — NICOTINE 14 MG/24HR TD PT24: 14.0000 mg | MEDICATED_PATCH | Freq: Every day | TRANSDERMAL | 0 refills | Status: AC
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
## 2023-07-30 NOTE — TOC Transition Note (Signed)
Transition of Care [facility]) - CM/SW Discharge Note   Patient Details  Name: Jennifer Mcdaniel MRN: [id number] Date of Birth: [birth year]  Transition of Care [facility]) CM/SW Contact:  Adrian Prows, RN Phone Number: 07/30/2023, 12:32 PM   Clinical Narrative:    TOC notified pt needs transportation in room; spoke w/ pt in room; pt says she has money for transportation, and she can call family members to pick her up; pt has home oxygen; pt says she does not have her travel tank; her home oxygen is w/ [employer]; notified Mitch at [employer]; travel tank will be delivered to room; [facility] services previously arranged w/ [facility]; notified Heather at agency that pt will d/c today; pt will arrange her own transportation; no TOC needs.   Final next level of care: Home w Home Health Services Barriers to Discharge: No Barriers Identified   Patient Goals and CMS Choice CMS Medicare.gov Compare Post Acute Care list provided to::  (NA) Choice offered to / list presented to :  (NA)  Discharge Placement                         Discharge Plan and Services Additional resources added to the After Visit Summary for   In-house Referral: Clinical Social Work Discharge Planning Services: NA Post Acute Care Choice: NA          DME Arranged: N/A DME Agency: NA       HH Arranged: PT, OT HH Agency: [facility] ([facility]) Date HH Agency Contacted: 07/29/23 Time HH Agency Contacted: 1603 Representative spoke with at [facility] Agency: Duwaine Maxin  Social Determinants of Health (SDOH) Interventions SDOH Screenings   Food Insecurity: No Food Insecurity (07/27/2023)  Housing: Low Risk  (07/27/2023)  Transportation Needs: Unmet Transportation Needs (07/27/2023)  Utilities: Not At Risk (07/27/2023)  Social Connections: Unknown (08/23/2022)   Received from [facility]  Tobacco Use: High Risk (07/27/2023)     Readmission Risk Interventions     No data to display

## 2023-07-30 NOTE — TOC Progression Note (Signed)
Transition of Care [facility]) - Progression Note    Patient Details  Name: Jennifer Mcdaniel MRN: [id number] Date of Birth: 1961/01/17  Transition of Care [facility]) CM/SW Contact  Georgie Chard, LCSW Phone Number: 07/30/2023, 2:01 PM  Clinical Narrative:    CSW brought taxi-voucher to patient's room. At this time patient has refused voucher at this time wanted to know if TOC had other transportation. Patient reports that she will continue to call her husband as she is not wanting to take a taxi.    Expected Discharge Plan: Home/Self Care Barriers to Discharge: No Barriers Identified  Expected Discharge Plan and Services In-house Referral: Clinical Social Work Discharge Planning Services: NA Post Acute Care Choice: NA Living arrangements for the past 2 months: Single Family Home Expected Discharge Date: 07/30/23               DME Arranged: N/A DME Agency: NA       HH Arranged: PT, OT HH Agency: [facility] ([facility]) Date HH Agency Contacted: 07/29/23 Time HH Agency Contacted: 1603 Representative spoke with at [facility] Agency: Duwaine Maxin   Social Determinants of Health (SDOH) Interventions SDOH Screenings   Food Insecurity: No Food Insecurity (07/27/2023)  Housing: Low Risk  (07/27/2023)  Transportation Needs: Unmet Transportation Needs (07/27/2023)  Utilities: Not At Risk (07/27/2023)  Social Connections: Unknown (08/23/2022)   Received from [facility]  Tobacco Use: High Risk (07/27/2023)    Readmission Risk Interventions     No data to display

## 2023-07-30 NOTE — Progress Notes (Signed)
Patient called out to front desk and stated she "did not feel well". Vital signs are stable and blood sugar was checked 86. After assessing patient she than said she feels better. Will continue to monitor.

## 2023-07-30 NOTE — Plan of Care (Signed)
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
## 2023-07-31 DIAGNOSIS — J441 Chronic obstructive pulmonary disease with (acute) exacerbation: Secondary | ICD-10-CM | POA: Diagnosis not present

## 2023-07-31 MED ORDER — DM-GUAIFENESIN ER 30-600 MG PO TB12
1.0000 | ORAL_TABLET | Freq: Two times a day (BID) | ORAL | Status: DC
  Administered 2023-07-31 – 2023-08-01 (×3): 1 via ORAL
  Filled 2023-07-31 (×3): qty 1

## 2023-07-31 MED ORDER — BENZONATATE 100 MG PO CAPS
100.0000 mg | ORAL_CAPSULE | Freq: Three times a day (TID) | ORAL | Status: DC
  Administered 2023-07-31 – 2023-08-01 (×4): 100 mg via ORAL
  Filled 2023-07-31 (×4): qty 1
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
## 2023-07-31 NOTE — Plan of Care (Signed)
?  Problem: Clinical Measurements: ?Goal: Diagnostic test results will improve ?Outcome: Progressing ?Goal: Respiratory complications will improve ?Outcome: Progressing ?  ?Problem: Activity: ?Goal: Risk for activity intolerance will decrease ?Outcome: Progressing ?  ?Problem: Coping: ?Goal: Level of anxiety will decrease ?Outcome: Progressing ?  ?

## 2023-08-01 DIAGNOSIS — E43 Unspecified severe protein-calorie malnutrition: Secondary | ICD-10-CM | POA: Insufficient documentation

## 2023-08-01 DIAGNOSIS — J441 Chronic obstructive pulmonary disease with (acute) exacerbation: Secondary | ICD-10-CM | POA: Diagnosis not present

## 2023-08-01 LAB — CBC
HCT: 35.3 % — ABNORMAL LOW (ref 36.0–46.0)
Hemoglobin: 11.6 g/dL — ABNORMAL LOW (ref 12.0–15.0)
MCH: 30.3 pg (ref 26.0–34.0)
MCHC: 32.9 g/dL (ref 30.0–36.0)
MCV: 92.2 fL (ref 80.0–100.0)
Platelets: 460 10*3/uL — ABNORMAL HIGH (ref 150–400)
RBC: 3.83 MIL/uL — ABNORMAL LOW (ref 3.87–5.11)
RDW: 12.5 % (ref 11.5–15.5)
WBC: 11.9 10*3/uL — ABNORMAL HIGH (ref 4.0–10.5)
nRBC: 0 % (ref 0.0–0.2)

## 2023-08-01 LAB — BASIC METABOLIC PANEL
Anion gap: 10 (ref 5–15)
BUN: 16 mg/dL (ref 8–23)
CO2: 33 mmol/L — ABNORMAL HIGH (ref 22–32)
Calcium: 9.2 mg/dL (ref 8.9–10.3)
Chloride: 92 mmol/L — ABNORMAL LOW (ref 98–111)
Creatinine, Ser: 0.44 mg/dL (ref 0.44–1.00)
GFR, Estimated: >60 mL/min (ref >60)
Glucose, Bld: 84 mg/dL (ref 70–99)
Potassium: 3.8 mmol/L (ref 3.5–5.1)
Sodium: 135 mmol/L (ref 135–145)

## 2023-08-01 MED ORDER — BUSPIRONE HCL 10 MG PO TABS
10.0000 mg | ORAL_TABLET | Freq: Once | ORAL | Status: AC
  Administered 2023-08-01: 10 mg via ORAL
  Filled 2023-08-01: qty 1

## 2023-08-01 MED ORDER — DOCUSATE SODIUM 100 MG PO CAPS: 100.0000 mg | ORAL_CAPSULE | Freq: Every day | ORAL | 0 refills | Status: AC | PRN

## 2023-08-01 MED ORDER — ENSURE ENLIVE PO LIQD: 237.0000 mL | Freq: Four times a day (QID) | ORAL | 0 refills | Status: AC

## 2023-08-01 MED ORDER — ENSURE ENLIVE PO LIQD: 237.0000 mL | Freq: Four times a day (QID) | ORAL | Status: DC

## 2023-08-01 NOTE — Progress Notes (Signed)
Initial Nutrition Assessment  DOCUMENTATION CODES:   Severe malnutrition in context of chronic illness, Underweight  INTERVENTION:   -Ensure Plus High Protein po QID, each supplement provides 350 kcal and 20 grams of protein.   -Multivitamin with minerals daily  -Provided Ensure coupons to help purchase supplements at home and recommended she drink at least 3 daily.  -If patient is ever readmitted, would benefit from feeding tube placement and enteral nutrition given extent of malnutrition.  NUTRITION DIAGNOSIS:   Severe Malnutrition related to chronic illness (COPD) as evidenced by severe fat depletion, severe muscle depletion, energy intake < or equal to 75% for > or equal to 1 month.  GOAL:   Patient will meet greater than or equal to 90% of their needs  MONITOR:   PO intake, Supplement acceptance, Labs, Weight trends, I & O's  REASON FOR ASSESSMENT:   Other (Comment)  Low BMI  ASSESSMENT:   PMH of COPD, chronic respiratory failure on 5 LPM, depression, anxiety, ADHD, active smoker presented to the hospital with complaints of confusion and suicidal thoughts.  Patient in room, states she has an appetite but knows she doesn't eat as much as she should. Drinks Ensure at home but can only afford to drink 1 daily at most. Ideally pt should be consuming 3-4 daily to meet increased nutritional needs. Pt didn't eat breakfast but drank an Ensure instead. Pt denies issues with swallowing, chewing or taste changes.  Provided pt Ensure coupons to use, states she will get her brother to buy some for her. Recommended she drink at least 3 daily along with meals.  Noted discharge order placed today. Pt would likely benefit from tube feedings in any future admissions given the extent of her malnutrition.   Per bed scale: 73 lbs. Weight records have a weight of 80 lbs.   Medications: Multivitamin with minerals daily  Labs reviewed: CBGs: 86   NUTRITION - FOCUSED PHYSICAL  EXAM:  Flowsheet Row Most Recent Value  Orbital Region Severe depletion  Upper Arm Region Severe depletion  Thoracic and Lumbar Region Severe depletion  Buccal Region Severe depletion  Temple Region Severe depletion  Clavicle Bone Region Severe depletion  Clavicle and Acromion Bone Region Severe depletion  Scapular Bone Region Severe depletion  Dorsal Hand Severe depletion  Patellar Region Severe depletion  Anterior Thigh Region Severe depletion  Posterior Calf Region Severe depletion  Edema (RD Assessment) None  Hair Reviewed  Eyes Reviewed  Mouth Reviewed  [poor dentition]  Skin Reviewed       Diet Order:   Diet Order             Diet general           Diet regular Room service appropriate? Yes; Fluid consistency: Thin  Diet effective now                   EDUCATION NEEDS:   Education needs have been addressed  Skin:  Skin Assessment: Reviewed RN Assessment  Last BM:  11/1  Height:   Ht Readings from Last 1 Encounters:  07/27/23 5\' 6"  (1.676 m)    Weight:   Wt Readings from Last 1 Encounters:  07/27/23 36.3 kg    BMI:  Body mass index is 12.92 kg/m.  Estimated Nutritional Needs:   Kcal:  1700-1900  Protein:  85-100g  Fluid:  1.9L/day   Tilda Franco, MS, RD, LDN Inpatient Clinical Dietitian Contact information available via Amion
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
## 2023-08-01 NOTE — Plan of Care (Signed)

## 2023-08-01 NOTE — Discharge Instructions (Signed)
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
## 2024-08-27 DEATH — deceased
